# Patient Record
Sex: Male | Born: 1955 | Race: White | Hispanic: No | Marital: Married | Smoking: Never smoker
Health system: Southern US, Community
[De-identification: ages and names within clinical notes are randomized; demographics above are authoritative.]

## PROBLEM LIST (undated history)

## (undated) DIAGNOSIS — I498 Other specified cardiac arrhythmias: Secondary | ICD-10-CM

## (undated) DIAGNOSIS — I471 Supraventricular tachycardia, unspecified: Secondary | ICD-10-CM

## (undated) DIAGNOSIS — M1990 Unspecified osteoarthritis, unspecified site: Secondary | ICD-10-CM

## (undated) DIAGNOSIS — R002 Palpitations: Secondary | ICD-10-CM

## (undated) HISTORY — DX: Supraventricular tachycardia, unspecified: I47.10

## (undated) HISTORY — PX: NO PAST SURGERIES: SHX2092

## (undated) HISTORY — DX: Supraventricular tachycardia: I47.1

## (undated) HISTORY — DX: Palpitations: R00.2

## (undated) HISTORY — DX: Unspecified osteoarthritis, unspecified site: M19.90

## (undated) HISTORY — DX: Other specified cardiac arrhythmias: I49.8

---

## 2000-08-10 ENCOUNTER — Encounter: Admission: RE | Admit: 2000-08-10 | Discharge: 2000-08-26 | Payer: Self-pay | Admitting: *Deleted

## 2001-07-31 ENCOUNTER — Encounter: Payer: Self-pay | Admitting: Emergency Medicine

## 2001-07-31 ENCOUNTER — Emergency Department (HOSPITAL_COMMUNITY): Admission: EM | Admit: 2001-07-31 | Discharge: 2001-07-31 | Payer: Self-pay | Admitting: Emergency Medicine

## 2003-05-11 ENCOUNTER — Encounter: Payer: Self-pay | Admitting: Orthopedic Surgery

## 2003-05-11 ENCOUNTER — Ambulatory Visit (HOSPITAL_COMMUNITY): Admission: RE | Admit: 2003-05-11 | Discharge: 2003-05-11 | Payer: Self-pay | Admitting: Orthopedic Surgery

## 2003-05-24 ENCOUNTER — Ambulatory Visit (HOSPITAL_BASED_OUTPATIENT_CLINIC_OR_DEPARTMENT_OTHER): Admission: RE | Admit: 2003-05-24 | Discharge: 2003-05-24 | Payer: Self-pay | Admitting: Orthopedic Surgery

## 2006-10-25 ENCOUNTER — Encounter: Admission: RE | Admit: 2006-10-25 | Discharge: 2006-10-25 | Payer: Self-pay | Admitting: Internal Medicine

## 2006-11-02 ENCOUNTER — Encounter: Admission: RE | Admit: 2006-11-02 | Discharge: 2006-11-02 | Payer: Self-pay | Admitting: Internal Medicine

## 2010-12-03 ENCOUNTER — Ambulatory Visit
Admission: RE | Admit: 2010-12-03 | Discharge: 2010-12-03 | Disposition: A | Discharge status: Home / Self Care | Payer: BC Managed Care – PPO | Source: Ambulatory Visit | Attending: Family Medicine | Admitting: Family Medicine

## 2010-12-03 ENCOUNTER — Other Ambulatory Visit: Payer: Self-pay | Admitting: Family Medicine

## 2010-12-03 DIAGNOSIS — M79604 Pain in right leg: Secondary | ICD-10-CM

## 2013-12-19 ENCOUNTER — Encounter: Payer: Self-pay | Admitting: Cardiology

## 2014-01-24 ENCOUNTER — Ambulatory Visit: Payer: BC Managed Care – PPO | Admitting: Cardiology

## 2014-02-13 ENCOUNTER — Ambulatory Visit: Payer: BC Managed Care – PPO | Admitting: Cardiology

## 2014-02-15 ENCOUNTER — Encounter: Payer: Self-pay | Admitting: Cardiology

## 2014-02-15 ENCOUNTER — Ambulatory Visit (INDEPENDENT_AMBULATORY_CARE_PROVIDER_SITE_OTHER): Payer: BC Managed Care – PPO | Admitting: Cardiology

## 2014-02-15 VITALS — BP 135/78 | HR 53 | Ht 71.0 in | Wt 214.0 lb

## 2014-02-15 DIAGNOSIS — I471 Supraventricular tachycardia: Secondary | ICD-10-CM

## 2014-02-15 DIAGNOSIS — R002 Palpitations: Secondary | ICD-10-CM

## 2014-02-15 NOTE — Patient Instructions (Signed)
Your physician recommends that you continue on your current medications as directed. Please refer to the Current Medication list given to you today.  Your physician wants you to follow-up in: 1 year with Dr. Skains. You will receive a reminder letter in the mail two months in advance. If you don't receive a letter, please call our office to schedule the follow-up appointment.  

## 2014-02-15 NOTE — Progress Notes (Signed)
[address] [place], [place]  [address] Phone: [phone](336) [phone] Fax:  [phone]([phone]  Date:  02/15/2014   ID:  Colton Davidson, DOB 30-Sep-1956, MRN [id number]  PCP:  No primary provider on file.   History of Present Illness: Colton Davidson is a 58 y.o. male who is quite vigorous/active who is here for the follow up of palpitations.  Most of his occurring episodes occurred late at night and wake him up from sleep. Feels a thumping and feeling in his throat. LDL cholesterol 92. Drinks 3 cups of coffee in the morning green tea in the afternoon occasionally. Exercise is about 6 days a week vigorously for 15 minutes. Occasional anti-inflammatory after exercising. Electrolytes were unremarkable, TSH was normal, EKG demonstrated sinus bradycardia 48. No other acute changes.   Father MI in his 8060's, CABG in 6080's. Mother alive at 3490's   Palpitations have decreased since stopping caffiene. No syncope, no dizziness. No chest pain. No SOB.  HOLTER:  Minimum heart rate 34 at 5:55 AM, average 55, maximum 91 beats per minute. 129 PVCs, 188 PACs. There were runs of atrial tachycardia, 20 beats was the longest at 145 beats per minute. IMPRESSION: PVCs/PACs/PAT most likely cause of palpitations. No adverse arrhythmias identified.  He tried low-dose diltiazem 120 mg once a day long-acting. He noted that his heart rate did not increase as much during exercise. He noted some mild variation in his palpitations but not significant change. He stopped taking it.  He has no high-risk symptoms such as syncope, dizziness, chest pain. He is an avid Database administratorsoccer player.  Has not taken diltiazem in 2 years. Brief flutter. Very rare. He suffered soccer concussions.    Wt Readings from Last 3 Encounters:  02/15/14 214 lb (97.07 kg)     Past Medical History  Diagnosis Date  . Osteoarthritis     back  . Paroxysmal supraventricular tachycardia   . Palpitations   . Other specified cardiac  dysrhythmias(427.89)     bradycardia    No past surgical history on file.  Current Outpatient Prescriptions  Medication Sig Dispense Refill  . ibuprofen (ADVIL,MOTRIN) 100 MG tablet Take 100 mg by mouth as needed for fever.      . diltiazem (CARDIZEM) 120 MG tablet Take 120 mg by mouth as needed.        No current facility-administered medications for this visit.    Allergies:   No Known Allergies  Social History:  The patient  reports that he has never smoked. He does not have any smokeless tobacco history on file. He reports that he drinks alcohol. He reports that he does not use illicit drugs.   No family history on file.  ROS:  Please see the history of present illness.   Denies any fevers, chills, orthopnea, PND   All other systems reviewed and negative.   PHYSICAL EXAM: VS:  BP 135/78  Pulse 53  Ht 5\' 11"  (1.803 m)  Wt 214 lb (97.07 kg)  BMI 29.86 kg/m2 Well nourished, well developed, in no acute distress HEENT: normal, [place]/AT, EOMI Neck: no JVD, normal carotid upstroke, no bruit Cardiac:  normal S1, S2; RRR; no murmur Lungs:  clear to auscultation bilaterally, no wheezing, rhonchi or rales Abd: soft, nontender, no hepatomegaly, no bruits Ext: no edema, 2+ distal pulses Skin: warm and dry GU: deferred Neuro: no focal abnormalities noted, AAO x 3  EKG:  None today     ASSESSMENT AND  PLAN:  1. Palpitations-he has not taken diltiazem in several years. Continue to monitor. He desires to come back on a yearly basis for further cardiology monitoring. 2. Paroxysmal atrial tachycardia-no adverse symptoms. Continue to monitor.  Signed, Donato SchultzMark Sandeep Radell, MD [facility]  02/15/2014 2:13 PM

## 2015-02-18 ENCOUNTER — Ambulatory Visit (INDEPENDENT_AMBULATORY_CARE_PROVIDER_SITE_OTHER): Payer: BLUE CROSS/BLUE SHIELD | Admitting: Cardiology

## 2015-02-18 ENCOUNTER — Encounter: Payer: Self-pay | Admitting: Cardiology

## 2015-02-18 VITALS — BP 120/76 | HR 46 | Ht 71.0 in | Wt 224.4 lb

## 2015-02-18 DIAGNOSIS — I471 Supraventricular tachycardia: Secondary | ICD-10-CM | POA: Insufficient documentation

## 2015-02-18 DIAGNOSIS — R001 Bradycardia, unspecified: Secondary | ICD-10-CM | POA: Insufficient documentation

## 2015-02-18 DIAGNOSIS — R002 Palpitations: Secondary | ICD-10-CM | POA: Diagnosis not present

## 2015-02-18 NOTE — Progress Notes (Signed)
[address] [place], [place]  [address] Phone: [phone](336) [phone] Fax:  [phone]  Date:  02/18/2015   ID:  Colton Davidson, DOB 25-Jan-1956, MRN [id number]  PCP:  Johny BlamerHARRIS, WILLIAM, MD   History of Present Illness: Colton Davidson is a 59 y.o. male who is quite vigorous/active who is here for the follow up of palpitations.  Most of his occurring episodes occurred late at night and wake him up from sleep. Feels a thumping and feeling in his throat. LDL cholesterol 92. Drinks 3 cups of coffee in the morning green tea in the afternoon occasionally. Exercise is about 6 days a week vigorously for 15 minutes. Occasional anti-inflammatory after exercising. Electrolytes were unremarkable, TSH was normal, EKG demonstrated sinus bradycardia 48. No other acute changes.   Father MI in his 4160's, CABG in 3380's. Mother alive at 3090's   Palpitations have decreased since stopping caffiene. No syncope, no dizziness. No chest pain. No SOB.  HOLTER:  Minimum heart rate 34 at 5:55 AM, average 55, maximum 91 beats per minute. 129 PVCs, 188 PACs. There were runs of atrial tachycardia, 20 beats was the longest at 145 beats per minute. IMPRESSION: PVCs/PACs/PAT most likely cause of palpitations. No adverse arrhythmias identified.  He tried low-dose diltiazem 120 mg once a day long-acting. He noted that his heart rate did not increase as much during exercise. He noted some mild variation in his palpitations but not significant change. He stopped taking it.  He has no high-risk symptoms such as syncope, dizziness, chest pain. He is an avid Database administratorsoccer player.   Has not taken diltiazem inl several years. Brief flutter. Very rare.   Enjoys playing soccer. Had a back injury. Currently doing therapy for this. He has not noticed any syncopal episodes. 3 children are going to schools, [employer], private [place] in [place], [employer] now [employer].   Wt Readings from Last 3 Encounters:  02/18/15 224 lb  6.4 oz (101.787 kg)  02/15/14 214 lb (97.07 kg)     Past Medical History  Diagnosis Date  . Osteoarthritis     back  . Paroxysmal supraventricular tachycardia   . Palpitations   . Other specified cardiac dysrhythmias(427.89)     bradycardia    History reviewed. No pertinent past surgical history.  Current Outpatient Prescriptions  Medication Sig Dispense Refill  . diltiazem (CARDIZEM) 120 MG tablet Take 120 mg by mouth as needed (A-Fib).     Marland Kitchen. ibuprofen (ADVIL,MOTRIN) 100 MG tablet Take 100 mg by mouth as needed for fever.     No current facility-administered medications for this visit.    Allergies:   No Known Allergies  Social History:  The patient  reports that he has never smoked. He does not have any smokeless tobacco history on file. He reports that he drinks alcohol. He reports that he does not use illicit drugs.   Family History  Problem Relation Age of Onset  . Hyperlipidemia Mother   . Heart attack Father   . Heart disease Father   . Heart failure Father     ROS:  Please see the history of present illness.   Denies any fevers, chills, orthopnea, PND. No syncope.   All other systems reviewed and negative.   PHYSICAL EXAM: VS:  BP 120/76 mmHg  Pulse 46  Ht 5\' 11"  (1.803 m)  Wt 224 lb 6.4 oz (101.787 kg)  BMI 31.31 kg/m2 Well nourished, well developed, in no acute  distress HEENT: normal, [place]/AT, EOMI Neck: no JVD, normal carotid upstroke, no bruit Cardiac:  normal S1, S2; RRR; no murmur Lungs:  clear to auscultation bilaterally, no wheezing, rhonchi or rales Abd: soft, nontender, no hepatomegaly, no bruits Ext: no edema, 2+ distal pulses Skin: warm and dry GU: deferred Neuro: no focal abnormalities noted, AAO x 3  EKG:  02/18/15-sinus bradycardia, 46, first-degree AV block, incomplete right bundle branch block. No significant change from prior EKG.   ASSESSMENT AND PLAN:  1. Palpitations-he has not taken diltiazem in several years. Continue to monitor.  He desires to come back on a yearly basis for further cardiology monitoring. Mild weight gain noted. He is monitoring this. Decrease physical activity. He feels overall well. 2. Sinus bradycardia-asymptomatic. Continue to monitor. 3. Paroxysmal atrial tachycardia-no adverse symptoms. Continue to monitor. 4. One-year follow-up.  Signed, Donato Schultz, MD [facility]  02/18/2015 10:08 AM

## 2015-02-18 NOTE — Patient Instructions (Signed)
Medication Instructions:  Your physician recommends that you continue on your current medications as directed. Please refer to the Current Medication list given to you today.  Labwork: None  Testing/Procedures: None  Follow-Up: Follow up in 1 year with Dr. Skains.  You will receive a letter in the mail 2 months before you are due.  Please call us when you receive this letter to schedule your follow up appointment.  Thank you for choosing [facility]!!       

## 2016-02-21 ENCOUNTER — Encounter: Payer: Self-pay | Admitting: Cardiology

## 2016-02-21 ENCOUNTER — Ambulatory Visit (INDEPENDENT_AMBULATORY_CARE_PROVIDER_SITE_OTHER): Payer: BLUE CROSS/BLUE SHIELD | Admitting: Cardiology

## 2016-02-21 VITALS — BP 130/84 | HR 56 | Ht 71.0 in | Wt 218.0 lb

## 2016-02-21 DIAGNOSIS — I471 Supraventricular tachycardia: Secondary | ICD-10-CM

## 2016-02-21 DIAGNOSIS — E785 Hyperlipidemia, unspecified: Secondary | ICD-10-CM

## 2016-02-21 DIAGNOSIS — R001 Bradycardia, unspecified: Secondary | ICD-10-CM

## 2016-02-21 NOTE — Patient Instructions (Signed)
Medication Instructions:  Your physician recommends that you continue on your current medications as directed. Please refer to the Current Medication list given to you today.   Labwork: Your physician recommends that you return for lab work in: FASTING (Lipid/Liver)   Testing/Procedures: none  Follow-Up: Your physician wants you to follow-up in: 12 months with Dr. Anne FuSkains. You will receive a reminder letter in the mail two months in advance. If you don't receive a letter, please call our office to schedule the follow-up appointment.   Any Other Special Instructions Will Be Listed Below (If Applicable).     If you need a refill on your cardiac medications before your next appointment, please call your pharmacy.

## 2016-02-21 NOTE — Progress Notes (Signed)
[address] [place], [place]  [address] Phone: [phone](336) [phone] Fax:  [phone](336) [phone]  Date:  02/21/2016   ID:  Colton Davidson, DOB 28-Nov-1955, MRN [id number]  PCP:  Johny BlamerHARRIS, WILLIAM, MD   History of Present Illness: Colton BaltimoreJoseph M Student is a 60 y.o. male who is quite vigorous/active who is here for the follow up of palpitations.  Most of his occurring episodes occurred late at night and wake him up from sleep. Feels a thumping and feeling in his throat. LDL cholesterol 92. Drinks 3 cups of coffee in the morning green tea in the afternoon occasionally. Exercise is about 6 days a week vigorously for 15 minutes. Occasional anti-inflammatory after exercising. Electrolytes were unremarkable, TSH was normal, EKG demonstrated sinus bradycardia 48. No other acute changes.   Father MI in his 7060's, CABG in 9280's. Mother alive at 4890's   Palpitations have decreased since stopping caffiene. No syncope, no dizziness. No chest pain. No SOB.  HOLTER:  Minimum heart rate 34 at 5:55 AM, average 55, maximum 91 beats per minute. 129 PVCs, 188 PACs. There were runs of atrial tachycardia, 20 beats was the longest at 145 beats per minute. IMPRESSION: PVCs/PACs/PAT most likely cause of palpitations. No adverse arrhythmias identified.  He tried low-dose diltiazem 120 mg once a day long-acting but has not taken diltiazem in several years.. He noted that his heart rate did not increase as much during exercise. He noted some mild variation in his palpitations but not significant change. He stopped taking it.  He has no high-risk symptoms such as syncope, dizziness, chest pain. He is an avid Database administratorsoccer player.   Enjoys playing soccer. Had a back injury. Currently doing therapy for this. Other than this, he is doing very well, staying active. His parents lived into their 6290s. No chest pain He has not noticed any syncopal episodes. 3 children are going to schools, [employer], private college in [place], [employer] now [employer] graduate engineering.     Wt Readings from Last 3 Encounters:  02/21/16 218 lb (98.884 kg)  02/18/15 224 lb 6.4 oz (101.787 kg)  02/15/14 214 lb (97.07 kg)     Past Medical History  Diagnosis Date  . Osteoarthritis     back  . Paroxysmal supraventricular tachycardia (HCC)   . Palpitations   . Other specified cardiac dysrhythmias(427.89)     bradycardia    Past Surgical History  Procedure Laterality Date  . No past surgeries      Current Outpatient Prescriptions  Medication Sig Dispense Refill  . diltiazem (CARDIZEM) 120 MG tablet Take 120 mg by mouth as needed (A-Fib).     Marland Kitchen. ibuprofen (ADVIL,MOTRIN) 100 MG tablet Take 100 mg by mouth as needed for fever.     No current facility-administered medications for this visit.    Allergies:   No Known Allergies  Social History:  The patient  reports that he has never smoked. He does not have any smokeless tobacco history on file. He reports that he drinks alcohol. He reports that he does not use illicit drugs.   Family History  Problem Relation Age of Onset  . Hyperlipidemia Mother   . Heart attack Father   . Heart disease Father   . Heart failure Father     ROS:  Please see the history of present illness.   Denies any fevers, chills, orthopnea, PND. No syncope.   All other systems reviewed and negative.   PHYSICAL  EXAM: VS:  BP 130/84 mmHg  Pulse 56  Ht  (1.803 m)  Wt 218 lb (98.884 kg)  BMI 30.42 kg/m2 Well nourished, well developed, in no acute distress HEENT: normal, [place]/AT, EOMI Neck: no JVD, normal carotid upstroke, no bruit Cardiac:  normal S1, S2; RRR; no murmur Lungs:  clear to auscultation bilaterally, no wheezing, rhonchi or rales Abd: soft, nontender, no hepatomegaly, no bruits Ext: no edema, 2+ distal pulses Skin: warm and dry GU: deferred Neuro: no focal abnormalities noted, AAO x 3  EKG:  EKG was ordered today.  02/21/16-sinus bradycardia rate 56, PVC, normal intervals, nonspecific  T-wave changes in 3, aVF. Personally viewed-no change 02/18/15-sinus bradycardia, 46, first-degree AV block, incomplete right bundle branch block. No significant change from prior EKG.   ASSESSMENT AND PLAN:  1. Palpitations-he has not taken diltiazem in several years. Continue to monitor. He desires to come back on a yearly basis for further cardiology monitoring. Mild weight gain noted. He is monitoring this. Doing well, no changes. Baseline  2. Sinus bradycardia-asymptomatic. Continue to monitor. 3. Paroxysmal atrial tachycardia-no adverse symptoms. Continue to monitor. 4. Lipid screening-we'll check lipid profile. 5. One-year follow-up.  Signed, Donato Schultz, MD [facility]  02/21/2016 8:26 AM

## 2016-02-24 ENCOUNTER — Other Ambulatory Visit (INDEPENDENT_AMBULATORY_CARE_PROVIDER_SITE_OTHER): Payer: BLUE CROSS/BLUE SHIELD | Admitting: *Deleted

## 2016-02-24 DIAGNOSIS — E785 Hyperlipidemia, unspecified: Secondary | ICD-10-CM | POA: Diagnosis not present

## 2016-02-24 LAB — HEPATIC FUNCTION PANEL
ALK PHOS: 47 U/L (ref 40–115)
ALT: 19 U/L (ref 9–46)
AST: 21 U/L (ref 10–35)
Albumin: 3.9 g/dL (ref 3.6–5.1)
BILIRUBIN INDIRECT: 0.3 mg/dL (ref 0.2–1.2)
Bilirubin, Direct: 0.1 mg/dL (ref <=0.2)
Total Bilirubin: 0.4 mg/dL (ref 0.2–1.2)
Total Protein: 6.1 g/dL (ref 6.1–8.1)

## 2016-02-24 LAB — LIPID PANEL
Cholesterol: 177 mg/dL (ref 125–200)
HDL: 54 mg/dL (ref >=40)
LDL CALC: 105 mg/dL (ref <130)
TRIGLYCERIDES: 88 mg/dL (ref <150)
Total CHOL/HDL Ratio: 3.3 Ratio (ref <=5.0)
VLDL: 18 mg/dL (ref <30)

## 2016-02-24 NOTE — Addendum Note (Signed)
Addended by: Tonita PhoenixBOWDEN, Acea Yagi K on: 02/24/2016 07:47 AM   Modules accepted: Orders

## 2016-02-24 NOTE — Addendum Note (Signed)
Addended by: Tonita PhoenixBOWDEN, Anjolie Majer K on: 02/24/2016 07:39 AM   Modules accepted: Orders

## 2016-08-02 ENCOUNTER — Ambulatory Visit (INDEPENDENT_AMBULATORY_CARE_PROVIDER_SITE_OTHER): Payer: PRIVATE HEALTH INSURANCE

## 2016-08-02 ENCOUNTER — Encounter (HOSPITAL_COMMUNITY): Payer: Self-pay | Admitting: Family Medicine

## 2016-08-02 ENCOUNTER — Ambulatory Visit (HOSPITAL_COMMUNITY)
Admission: EM | Admit: 2016-08-02 | Discharge: 2016-08-02 | Disposition: A | Discharge status: Home / Self Care | Payer: PRIVATE HEALTH INSURANCE | Attending: Internal Medicine | Admitting: Internal Medicine

## 2016-08-02 DIAGNOSIS — S63615A Unspecified sprain of left ring finger, initial encounter: Secondary | ICD-10-CM

## 2016-08-02 NOTE — ED Triage Notes (Signed)
Pt here for injury to left ring finger. sts he hit it with a soccer ball last night,. Finger very swollen and ring in place.

## 2016-08-02 NOTE — Discharge Instructions (Signed)
NO FRACTURE

## 2016-08-02 NOTE — ED Provider Notes (Signed)
CSN: [id number]     Arrival date & time 08/02/16  1310 History   First MD Initiated Contact with Patient 08/02/16 1405     Chief Complaint  Patient presents with  . Finger Injury   (Consider location/radiation/quality/duration/timing/severity/associated sxs/prior Treatment) HPI NP PT WAS PLAYING SOCCER, BALL HIT TIP OF LEFT RING FINGER. NOW SWOLLEN, BRUISED. WEDDING RING IN SITU. HOME TREATMENT INCLUDES ICE ELEVATION, TYLENOL.  Past Medical History:  Diagnosis Date  . Osteoarthritis    back  . Other specified cardiac dysrhythmias(427.89)    bradycardia  . Palpitations   . Paroxysmal supraventricular tachycardia [facility])    Past Surgical History:  Procedure Laterality Date  . NO PAST SURGERIES     Family History  Problem Relation Age of Onset  . Hyperlipidemia Mother   . Heart attack Father   . Heart disease Father   . Heart failure Father    Social History  Substance Use Topics  . Smoking status: Never Smoker  . Smokeless tobacco: Never Used  . Alcohol use Yes    Review of Systems  Denies: HEADACHE, NAUSEA, ABDOMINAL PAIN, CHEST PAIN, CONGESTION, DYSURIA, SHORTNESS OF BREATH  Allergies  Review of patient's allergies indicates no known allergies.  Home Medications   Prior to Admission medications   Medication Sig Start Date End Date Taking? Authorizing Provider  diltiazem (CARDIZEM) 120 MG tablet Take 120 mg by mouth as needed (A-Fib).     Historical Provider, MD  ibuprofen (ADVIL,MOTRIN) 100 MG tablet Take 100 mg by mouth as needed for fever.    Historical Provider, MD   Meds Ordered and Administered this Visit  Medications - No data to display  BP 128/72   Pulse (!) 54   Temp 98.6 F (37 C)   Resp 18   SpO2 100%  No data found.   Physical Exam NURSES NOTES AND VITAL SIGNS REVIEWED. CONSTITUTIONAL: Well developed, well nourished, no acute distress HEENT: normocephalic, atraumatic EYES: Conjunctiva normal NECK:normal ROM, supple, no  adenopathy PULMONARY:No respiratory distress, normal effort ABDOMINAL: Soft, ND, NT BS+, No CVAT MUSCULOSKELETAL: Normal ROM of all extremities, LEFT RING FINGER, SWOLLEN AT PIP, WITH VOLAR BRUISING NOTED.  SKIN: warm and dry without rash PSYCHIATRIC: Mood and affect, behavior are normal  Urgent Care Course   Clinical Course    Procedures (including critical care time)  Labs Review Labs Reviewed - No data to display  Imaging Review Dg Finger Ring Left  Result Date: 08/02/2016 CLINICAL DATA:  Finger injury with swelling. EXAM: LEFT RING FINGER 2+V COMPARISON:  None. FINDINGS: Three views study shows no evidence for an acute fracture. No subluxation or dislocation. The patient was unable to remove wedding ring which obscures a short segment of the mid proximal phalanx. Alignment of the bone is preserved across the ring making an obscured proximal phalanx fracture unlikely. IMPRESSION: Three views study shows marked soft tissue swelling without evidence of fracture or dislocation. A very short segment of the mid proximal phalanx is obscured by the wedding ring. Electronically Signed   By: Kennith Center M.D.   On: 08/02/2016 14:35     Visual Acuity Review  Right Eye Distance:   Left Eye Distance:   Bilateral Distance:    Right Eye Near:   Left Eye Near:    Bilateral Near:         MDM   1. Sprain of left ring finger, unspecified site of finger, initial encounter     Patient is reassured that there are no  issues that require transfer to higher level of care at this time or additional tests. Patient is advised to continue home symptomatic treatment. Patient is advised that if there are new or worsening symptoms to attend the emergency department, contact primary care provider, or return to UC. Instructions of care provided discharged home in stable condition.    THIS NOTE WAS GENERATED USING A VOICE RECOGNITION SOFTWARE PROGRAM. ALL REASONABLE EFFORTS  WERE MADE TO  PROOFREAD THIS DOCUMENT FOR ACCURACY.  I have verbally reviewed the discharge instructions with the patient. A printed AVS was given to the patient.  All questions were answered prior to discharge.      Tharon AquasFrank C Sarahgrace Broman, PA 08/02/16 [phone]

## 2017-02-23 ENCOUNTER — Encounter: Payer: Self-pay | Admitting: Cardiology

## 2017-02-23 ENCOUNTER — Ambulatory Visit (INDEPENDENT_AMBULATORY_CARE_PROVIDER_SITE_OTHER): Payer: PRIVATE HEALTH INSURANCE | Admitting: Cardiology

## 2017-02-23 ENCOUNTER — Encounter (INDEPENDENT_AMBULATORY_CARE_PROVIDER_SITE_OTHER): Payer: Self-pay

## 2017-02-23 VITALS — BP 138/70 | HR 58 | Ht 71.0 in | Wt 211.6 lb

## 2017-02-23 DIAGNOSIS — I209 Angina pectoris, unspecified: Secondary | ICD-10-CM | POA: Diagnosis not present

## 2017-02-23 DIAGNOSIS — R002 Palpitations: Secondary | ICD-10-CM | POA: Diagnosis not present

## 2017-02-23 DIAGNOSIS — I471 Supraventricular tachycardia: Secondary | ICD-10-CM | POA: Diagnosis not present

## 2017-02-23 DIAGNOSIS — R001 Bradycardia, unspecified: Secondary | ICD-10-CM | POA: Diagnosis not present

## 2017-02-23 DIAGNOSIS — Z01818 Encounter for other preprocedural examination: Secondary | ICD-10-CM

## 2017-02-23 LAB — PROTIME-INR
INR: 1 (ref 0.8–1.2)
PROTHROMBIN TIME: 10.5 s (ref 9.1–12.0)

## 2017-02-23 LAB — BASIC METABOLIC PANEL
BUN / CREAT RATIO: 16 (ref 9–20)
BUN: 15 mg/dL (ref 6–24)
CALCIUM: 9.1 mg/dL (ref 8.7–10.2)
CHLORIDE: 102 mmol/L (ref 96–106)
CO2: 22 mmol/L (ref 18–29)
CREATININE: 0.96 mg/dL (ref 0.76–1.27)
GFR calc Af Amer: 100 mL/min/{1.73_m2} (ref >59)
GFR calc non Af Amer: 87 mL/min/{1.73_m2} (ref >59)
GLUCOSE: 99 mg/dL (ref 65–99)
Potassium: 4.7 mmol/L (ref 3.5–5.2)
Sodium: 140 mmol/L (ref 134–144)

## 2017-02-23 LAB — CBC
HEMOGLOBIN: 14 g/dL (ref 13.0–17.7)
Hematocrit: 40.8 % (ref 37.5–51.0)
MCH: 32 pg (ref 26.6–33.0)
MCHC: 34.3 g/dL (ref 31.5–35.7)
MCV: 93 fL (ref 79–97)
PLATELETS: 210 10*3/uL (ref 150–379)
RBC: 4.37 x10E6/uL (ref 4.14–5.80)
RDW: 13.4 % (ref 12.3–15.4)
WBC: 5.2 10*3/uL (ref 3.4–10.8)

## 2017-02-23 MED ORDER — NITROGLYCERIN 0.4 MG SL SUBL: 0.4000 mg | SUBLINGUAL_TABLET | SUBLINGUAL | 99 refills | Status: AC | PRN

## 2017-02-23 MED ORDER — ASPIRIN EC 81 MG PO TBEC: 81.0000 mg | DELAYED_RELEASE_TABLET | Freq: Every day | ORAL | 3 refills | Status: DC

## 2017-02-23 NOTE — Patient Instructions (Signed)
Medication Instructions:  Please stop your Diltiazem. Start ASA 81 mg a day. You may use SL Ntg as needed for chest pain. Continue all other medications as listed.  Labwork: Please have blood work today (CBC, BMP and PT/INR)  Testing/Procedures: Your physician has requested that you have a cardiac catheterization. Cardiac catheterization is used to diagnose and/or treat various heart conditions. Doctors may recommend this procedure for a number of different reasons. The most common reason is to evaluate chest pain. Chest pain can be a symptom of coronary artery disease (CAD), and cardiac catheterization can show whether plaque is narrowing or blocking your heart's arteries. This procedure is also used to evaluate the valves, as well as measure the blood flow and oxygen levels in different parts of your heart. For further information please visit [email or web address] Please follow instruction sheet, as given.  Follow-Up: Follow up 2 to 3 weeks after your cardiac cath.  Hold off on any heavy workouts/activities until after your cardiac cath.  If you need a refill on your cardiac medications before your next appointment, please call your pharmacy.  Thank you for choosing [facility]!!    Nitroglycerin sublingual tablets What is this medicine? NITROGLYCERIN (nye troe GLI ser in) is a type of vasodilator. It relaxes blood vessels, increasing the blood and oxygen supply to your heart. This medicine is used to relieve chest pain caused by angina. It is also used to prevent chest pain before activities like climbing stairs, going outdoors in cold weather, or sexual activity. This medicine may be used for other purposes; ask your health care provider or pharmacist if you have questions. COMMON BRAND NAME(S): Nitroquick, Nitrostat, Nitrotab What should I tell my health care provider before I take this medicine? They need to know if you have any of these conditions: -anemia -head  injury, recent stroke, or bleeding in the brain -liver disease -previous heart attack -an unusual or allergic reaction to nitroglycerin, other medicines, foods, dyes, or preservatives -pregnant or trying to get pregnant -breast-feeding How should I use this medicine? Take this medicine by mouth as needed. At the first sign of an angina attack (chest pain or tightness) place one tablet under your tongue. You can also take this medicine 5 to 10 minutes before an event likely to produce chest pain. Follow the directions on the prescription label. Let the tablet dissolve under the tongue. Do not swallow whole. Replace the dose if you accidentally swallow it. It will help if your mouth is not dry. Saliva around the tablet will help it to dissolve more quickly. Do not eat or drink, smoke or chew tobacco while a tablet is dissolving. If you are not better within 5 minutes after taking ONE dose of nitroglycerin, call 9-1-1 immediately to seek emergency medical care. Do not take more than 3 nitroglycerin tablets over 15 minutes. If you take this medicine often to relieve symptoms of angina, your doctor or health care professional may provide you with different instructions to manage your symptoms. If symptoms do not go away after following these instructions, it is important to call 9-1-1 immediately. Do not take more than 3 nitroglycerin tablets over 15 minutes. Talk to your pediatrician regarding the use of this medicine in children. Special care may be needed. Overdosage: If you think you have taken too much of this medicine contact a poison control center or emergency room at once. NOTE: This medicine is only for you. Do not share this medicine with others. What if I miss  a dose? This does not apply. This medicine is only used as needed. What may interact with this medicine? Do not take this medicine with any of the following medications: -certain migraine medicines like ergotamine and dihydroergotamine  (DHE) -medicines used to treat erectile dysfunction like sildenafil, tadalafil, and vardenafil -riociguat This medicine may also interact with the following medications: -alteplase -aspirin -heparin -medicines for high blood pressure -medicines for mental depression -other medicines used to treat angina -phenothiazines like chlorpromazine, mesoridazine, prochlorperazine, thioridazine This list may not describe all possible interactions. Give your health care provider a list of all the medicines, herbs, non-prescription drugs, or dietary supplements you use. Also tell them if you smoke, drink alcohol, or use illegal drugs. Some items may interact with your medicine. What should I watch for while using this medicine? Tell your doctor or health care professional if you feel your medicine is no longer working. Keep this medicine with you at all times. Sit or lie down when you take your medicine to prevent falling if you feel dizzy or faint after using it. Try to remain calm. This will help you to feel better faster. If you feel dizzy, take several deep breaths and lie down with your feet propped up, or bend forward with your head resting between your knees. You may get drowsy or dizzy. Do not drive, use machinery, or do anything that needs mental alertness until you know how this drug affects you. Do not stand or sit up quickly, especially if you are an older patient. This reduces the risk of dizzy or fainting spells. Alcohol can make you more drowsy and dizzy. Avoid alcoholic drinks. Do not treat yourself for coughs, colds, or pain while you are taking this medicine without asking your doctor or health care professional for advice. Some ingredients may increase your blood pressure. What side effects may I notice from receiving this medicine? Side effects that you should report to your doctor or health care professional as soon as possible: -blurred vision -dry mouth -skin rash -sweating -the  feeling of extreme pressure in the head -unusually weak or tired Side effects that usually do not require medical attention (report to your doctor or health care professional if they continue or are bothersome): -flushing of the face or neck -headache -irregular heartbeat, palpitations -nausea, vomiting This list may not describe all possible side effects. Call your doctor for medical advice about side effects. You may report side effects to FDA at 1-800-FDA-1088. Where should I keep my medicine? Keep out of the reach of children. Store at room temperature between 20 and 25 degrees C (68 and 77 degrees F). Store in Retail buyeroriginal container. Protect from light and moisture. Keep tightly closed. Throw away any unused medicine after the expiration date. NOTE: This sheet is a summary. It may not cover all possible information. If you have questions about this medicine, talk to your doctor, pharmacist, or health care provider.  2018 Elsevier/Gold Standard (2013-08-03 17:57:36)    [facility] [address] [facility] [address], [address] [place] [address] Dept: [phone] Loc: [phone]  Henderson BaltimoreJoseph M Renninger  02/23/2017  You are scheduled for a cardiac cath on Thursday , Mar 04, 2017 with Dr. Swazil[place].  1. Please [facility]: [address] [place], [place] [address] at 5:30 am (two hours before your procedure to ensure your preparation). Free valet parking service is available.  Special note: Every effort is made to have your procedure done on time. Please understand that emergencies sometimes delay scheduled procedures.  2. Diet: Nothing to eat of drink after midnight.  3. Labs: Please have blood work today (BMP, CBC and PT/INR)  4. Medication instructions in preparation for your procedure:  On the morning of your procedure, take your ASA.  You may use sips of  water.  5. Plan for one night stay--bring personal belongings. 6. Bring a current list of your medications and current insurance cards. 7. You MUST have a responsible person to drive you home. 8. Someone MUST be with you the first 24 hours after you arrive home or your discharge will be delayed. 9. Please wear clothes that are easy to get on and off and wear slip-on shoes.  Thank you for allowing [place] to care for you!   -- [facility]

## 2017-02-23 NOTE — Progress Notes (Signed)
[address] [place], [place]  [address] Phone: [phone] Fax:  [phone]  Date:  02/23/2017   ID:  Colton Davidson, DOB 1956/08/14, MRN [id number]  PCP:  Johny Blamer, MD   History of Present Illness: Colton Davidson is a 61 y.o. male who is quite vigorous/active who is here for the follow up of palpitations.  Most of his occurring episodes occurred late at night and wake him up from sleep. Feels a thumping and feeling in his throat.  Used to drinks 3 cups of coffee in the morning green tea in the afternoon occasionally. Exercises about 6 days a week vigorously for 15 minutes. Occasional anti-inflammatory after exercising. Electrolytes were unremarkable, TSH was normal, EKG demonstrated sinus bradycardia 48. No other acute changes.   Father MI in his 64's, CABG in 81's. Mother alive at 66's   Palpitations have decreased since stopping caffiene. Denies syncope, dizziness, orthopnea, PND, fevers.   Since our last visit, he has been experiencing chest tightness, burning when heavily exerting himself with radiation to his neck and jaw that is relieved when he decreases his effort. He has been able to reduplicate the symptoms. His EKG also on 02/23/17 demonstrates new T-wave inversions in V3 (biphasic), V4 V5 and V6. These are concerning for ischemia. He felt associated shortness of breath although it is not unusual for him to push himself and feel winded during heavy exercise. No syncope, no lightheadedness. The symptoms began 10 minutes into exercise when pushing himself on the stairmaster or elliptical machine for instance. HOLTER:  Minimum heart rate 34 at 5:55 AM, average 55, maximum 91 beats per minute. 129 PVCs, 188 PACs. There were runs of atrial tachycardia, 20 beats was the longest at 145 beats per minute. IMPRESSION: PVCs/PACs/PAT most likely cause of palpitations. No adverse arrhythmias identified.  He tried low-dose diltiazem 120 mg once a day long-acting at  one point but has not taken diltiazem in several years.. He noted that his heart rate did not increase as much during exercise. He noted some mild variation in his palpitations but not significant change. He stopped taking it. He is an avid Database administrator.  His parents lived into their 28s. 3 children are going to schools, [employer], private college in [place], [employer] now [employer].     Wt Readings from Last 3 Encounters:  02/23/17 211 lb 9.6 oz (96 kg)  02/21/16 218 lb (98.9 kg)  02/18/15 224 lb 6.4 oz (101.8 kg)     Past Medical History:  Diagnosis Date  . Osteoarthritis    back  . Other specified cardiac dysrhythmias(427.89)    bradycardia  . Palpitations   . Paroxysmal supraventricular tachycardia [facility])     Past Surgical History:  Procedure Laterality Date  . NO PAST SURGERIES      Current Outpatient Prescriptions  Medication Sig Dispense Refill  . diltiazem (CARDIZEM) 120 MG tablet Take 120 mg by mouth as needed (A-Fib).     Marland Kitchen ibuprofen (ADVIL,MOTRIN) 100 MG tablet Take 100 mg by mouth as needed for fever.     No current facility-administered medications for this visit.     Allergies:   No Known Allergies  Social History:  The patient  reports that he has never smoked. He has never used smokeless tobacco. He reports that he drinks alcohol. He reports that he does not use drugs.   Family History  Problem Relation Age of Onset  .  Hyperlipidemia Mother   . Heart attack Father   . Heart disease Father   . Heart failure Father     ROS:  Please see the history of present illness.   Denies any fevers, chills, orthopnea, PND. No syncope.   Unless specified above, all other ROS negative  PHYSICAL EXAM: VS:  BP 138/70   Pulse (!) 58   Ht 5\' 11"  (1.803 m)   Wt 211 lb 9.6 oz (96 kg)   BMI 29.51 kg/m  GEN: Well nourished, well developed, in no acute distress  HEENT: normal  Neck: no JVD, carotid bruits, or masses Cardiac: RRR; no murmurs,  rubs, or gallops,no edema  Respiratory:  clear to auscultation bilaterally, normal work of breathing GI: soft, nontender, nondistended, + BS MS: no deformity or atrophy  Skin: warm and dry, no rash Neuro:  Alert and Oriented x 3, Strength and sensation are intact Psych: euthymic mood, full affect   EKG:  EKG was ordered today.  02/23/17-sinus bradycardia first degree AV block, PVC noted, nonspecific T-wave changes in V3 through V6 with T-wave changes out in V4 through V6 seemed new when compared to prior. 02/21/16-sinus bradycardia rate 56, PVC, normal intervals, nonspecific T-wave changes in , aVF. Personally viewed-no change 02/18/15-sinus bradycardia, 46, first-degree AV block, incomplete right bundle branch block. No significant change from prior EKG.   ASSESSMENT AND PLAN:  Exertional angina with EKG abnormality concerning for ischemia  - Given his father's family history and chest tightness upon exertional activity and EKG with T-wave inversion/ST depression in V6, biphasic T-wave in V3, T-wave inversion in V4 and V5, I would like to proceed with diagnostic heart catheterization via the right radial artery approach. Risks and benefits have been explained including stroke, heart attack, death, bleeding, renal impairment. He is willing to proceed. [place] to [place] to be with his son who is getting several awards at his college. He will be able to do this cardiac catheterization next week.  - Prescribing aspirin 81 mg.  - Nitroglycerin sublingual when necessary.  - No beta blocker because of resting bradycardia.  - Hold off from heavy exertional exercise.  Palpitations  - Since decreasing caffeine use, these have subsided  - No longer is he taking diltiazem  - Overall doing quite well. PVC noted on ECG.  Sinus bradycardia  - Ranging from the 40s to 50s. Asymptomatic. No pacemaker warranted.  Paroxysmal atrial tachycardia  - Seen previously on Holter monitor.  - Overall stable  without any symptoms.  Post catheterization follow-up.  Signed, Donato SchultzMark Skains, MD [facility]  02/23/2017 8:25 AM

## 2017-03-04 ENCOUNTER — Encounter (HOSPITAL_COMMUNITY)
Admission: RE | Disposition: A | Discharge status: Home / Self Care | Payer: Self-pay | Source: Ambulatory Visit | Attending: Cardiology

## 2017-03-04 ENCOUNTER — Ambulatory Visit (HOSPITAL_COMMUNITY)
Admission: RE | Admit: 2017-03-04 | Discharge: 2017-03-04 | Disposition: A | Discharge status: Home / Self Care | Payer: PRIVATE HEALTH INSURANCE | Source: Ambulatory Visit | Attending: Cardiology | Admitting: Cardiology

## 2017-03-04 DIAGNOSIS — R079 Chest pain, unspecified: Secondary | ICD-10-CM | POA: Diagnosis present

## 2017-03-04 DIAGNOSIS — I25118 Atherosclerotic heart disease of native coronary artery with other forms of angina pectoris: Secondary | ICD-10-CM | POA: Diagnosis not present

## 2017-03-04 DIAGNOSIS — M1990 Unspecified osteoarthritis, unspecified site: Secondary | ICD-10-CM | POA: Diagnosis not present

## 2017-03-04 DIAGNOSIS — I471 Supraventricular tachycardia: Secondary | ICD-10-CM | POA: Diagnosis not present

## 2017-03-04 DIAGNOSIS — I25119 Atherosclerotic heart disease of native coronary artery with unspecified angina pectoris: Secondary | ICD-10-CM | POA: Diagnosis not present

## 2017-03-04 DIAGNOSIS — I4510 Unspecified right bundle-branch block: Secondary | ICD-10-CM | POA: Diagnosis not present

## 2017-03-04 DIAGNOSIS — I209 Angina pectoris, unspecified: Secondary | ICD-10-CM

## 2017-03-04 DIAGNOSIS — I208 Other forms of angina pectoris: Secondary | ICD-10-CM | POA: Diagnosis present

## 2017-03-04 DIAGNOSIS — Z8249 Family history of ischemic heart disease and other diseases of the circulatory system: Secondary | ICD-10-CM | POA: Insufficient documentation

## 2017-03-04 DIAGNOSIS — I2102 ST elevation (STEMI) myocardial infarction involving left anterior descending coronary artery: Secondary | ICD-10-CM

## 2017-03-04 DIAGNOSIS — I440 Atrioventricular block, first degree: Secondary | ICD-10-CM | POA: Insufficient documentation

## 2017-03-04 DIAGNOSIS — R9431 Abnormal electrocardiogram [ECG] [EKG]: Secondary | ICD-10-CM | POA: Diagnosis present

## 2017-03-04 DIAGNOSIS — I493 Ventricular premature depolarization: Secondary | ICD-10-CM | POA: Diagnosis not present

## 2017-03-04 DIAGNOSIS — I4719 Other supraventricular tachycardia: Secondary | ICD-10-CM | POA: Diagnosis present

## 2017-03-04 DIAGNOSIS — Z955 Presence of coronary angioplasty implant and graft: Secondary | ICD-10-CM

## 2017-03-04 HISTORY — PX: CORONARY STENT INTERVENTION: CATH118234

## 2017-03-04 HISTORY — PX: LEFT HEART CATH AND CORONARY ANGIOGRAPHY: CATH118249

## 2017-03-04 LAB — POCT ACTIVATED CLOTTING TIME
ACTIVATED CLOTTING TIME: 219 s
ACTIVATED CLOTTING TIME: 450 s
Activated Clotting Time: 202 seconds
Activated Clotting Time: 323 seconds

## 2017-03-04 SURGERY — LEFT HEART CATH AND CORONARY ANGIOGRAPHY
Anesthesia: LOCAL

## 2017-03-04 MED ORDER — IOPAMIDOL (ISOVUE-370) INJECTION 76%
INTRAVENOUS | Status: AC
Start: 2017-03-04 — End: ?
  Filled 2017-03-04: qty 100

## 2017-03-04 MED ORDER — SODIUM CHLORIDE 0.9% FLUSH: 3.0000 mL | Freq: Two times a day (BID) | INTRAVENOUS | Status: DC

## 2017-03-04 MED ORDER — VERAPAMIL HCL 2.5 MG/ML IV SOLN
INTRAVENOUS | Status: AC
  Filled 2017-03-04: qty 2

## 2017-03-04 MED ORDER — ASPIRIN 81 MG PO CHEW: 81.0000 mg | CHEWABLE_TABLET | Freq: Every day | ORAL | Status: DC

## 2017-03-04 MED ORDER — FENTANYL CITRATE (PF) 100 MCG/2ML IJ SOLN
INTRAMUSCULAR | Status: AC
  Filled 2017-03-04: qty 2

## 2017-03-04 MED ORDER — IOPAMIDOL (ISOVUE-370) INJECTION 76%
INTRAVENOUS | Status: DC | PRN
  Administered 2017-03-04: 220 mL via INTRA_ARTERIAL

## 2017-03-04 MED ORDER — HEPARIN (PORCINE) IN NACL 2-0.9 UNIT/ML-% IJ SOLN
INTRAMUSCULAR | Status: DC | PRN
  Administered 2017-03-04: 08:00:00 via INTRA_ARTERIAL

## 2017-03-04 MED ORDER — CLOPIDOGREL BISULFATE 300 MG PO TABS
ORAL_TABLET | ORAL | Status: AC
  Filled 2017-03-04: qty 1

## 2017-03-04 MED ORDER — SODIUM CHLORIDE 0.9 % IV SOLN: 250.0000 mL | INTRAVENOUS | Status: DC | PRN

## 2017-03-04 MED ORDER — FENTANYL CITRATE (PF) 100 MCG/2ML IJ SOLN
INTRAMUSCULAR | Status: DC | PRN
  Administered 2017-03-04: 25 ug via INTRAVENOUS

## 2017-03-04 MED ORDER — HEPARIN (PORCINE) IN NACL 2-0.9 UNIT/ML-% IJ SOLN
INTRAMUSCULAR | Status: AC | PRN
  Administered 2017-03-04: 1000 mL via INTRA_ARTERIAL

## 2017-03-04 MED ORDER — ATORVASTATIN CALCIUM 80 MG PO TABS
80.0000 mg | ORAL_TABLET | Freq: Every day | ORAL | Status: DC
  Administered 2017-03-04: 80 mg via ORAL
  Filled 2017-03-04: qty 1

## 2017-03-04 MED ORDER — HEPARIN SODIUM (PORCINE) 1000 UNIT/ML IJ SOLN
INTRAMUSCULAR | Status: AC
  Filled 2017-03-04: qty 1

## 2017-03-04 MED ORDER — SODIUM CHLORIDE 0.9 % WEIGHT BASED INFUSION
3.0000 mL/kg/h | INTRAVENOUS | Status: DC
  Administered 2017-03-04: 3 mL/kg/h via INTRAVENOUS

## 2017-03-04 MED ORDER — ASPIRIN 81 MG PO CHEW
81.0000 mg | CHEWABLE_TABLET | ORAL | Status: AC
  Administered 2017-03-04: 81 mg via ORAL

## 2017-03-04 MED ORDER — HEPARIN SODIUM (PORCINE) 1000 UNIT/ML IJ SOLN
INTRAMUSCULAR | Status: AC
Start: 2017-03-04 — End: ?
  Filled 2017-03-04: qty 1

## 2017-03-04 MED ORDER — IOPAMIDOL (ISOVUE-370) INJECTION 76%
INTRAVENOUS | Status: AC
  Filled 2017-03-04: qty 100

## 2017-03-04 MED ORDER — CLOPIDOGREL BISULFATE 300 MG PO TABS
ORAL_TABLET | ORAL | Status: DC | PRN
  Administered 2017-03-04: 600 mg via ORAL

## 2017-03-04 MED ORDER — ACETAMINOPHEN 325 MG PO TABS: 650.0000 mg | ORAL_TABLET | ORAL | Status: DC | PRN

## 2017-03-04 MED ORDER — MIDAZOLAM HCL 2 MG/2ML IJ SOLN
INTRAMUSCULAR | Status: DC | PRN
  Administered 2017-03-04: 2 mg via INTRAVENOUS

## 2017-03-04 MED ORDER — SODIUM CHLORIDE 0.9% FLUSH: 3.0000 mL | INTRAVENOUS | Status: DC | PRN

## 2017-03-04 MED ORDER — ANGIOPLASTY BOOK
Freq: Once | Status: DC
  Filled 2017-03-04: qty 1

## 2017-03-04 MED ORDER — ASPIRIN 81 MG PO CHEW
CHEWABLE_TABLET | ORAL | Status: AC
  Filled 2017-03-04: qty 1

## 2017-03-04 MED ORDER — CLOPIDOGREL BISULFATE 75 MG PO TABS: 75.0000 mg | ORAL_TABLET | Freq: Every day | ORAL | 10 refills | Status: DC

## 2017-03-04 MED ORDER — ATORVASTATIN CALCIUM 80 MG PO TABS: 80.0000 mg | ORAL_TABLET | Freq: Every day | ORAL | 6 refills | Status: DC

## 2017-03-04 MED ORDER — LIDOCAINE HCL (PF) 1 % IJ SOLN
INTRAMUSCULAR | Status: DC | PRN
  Administered 2017-03-04: 2 mL via INTRADERMAL

## 2017-03-04 MED ORDER — FAMOTIDINE IN NACL 20-0.9 MG/50ML-% IV SOLN
INTRAVENOUS | Status: AC
  Filled 2017-03-04: qty 50

## 2017-03-04 MED ORDER — LIDOCAINE HCL 1 % IJ SOLN
INTRAMUSCULAR | Status: AC
  Filled 2017-03-04: qty 20

## 2017-03-04 MED ORDER — FAMOTIDINE IN NACL 20-0.9 MG/50ML-% IV SOLN
INTRAVENOUS | Status: AC | PRN
  Administered 2017-03-04: 20 mg via INTRAVENOUS

## 2017-03-04 MED ORDER — CLOPIDOGREL BISULFATE 75 MG PO TABS: 75.0000 mg | ORAL_TABLET | Freq: Every day | ORAL | Status: DC

## 2017-03-04 MED ORDER — ASPIRIN EC 81 MG PO TBEC: 81.0000 mg | DELAYED_RELEASE_TABLET | Freq: Every day | ORAL | 0 refills | Status: DC

## 2017-03-04 MED ORDER — CLOPIDOGREL BISULFATE 75 MG PO TABS: 75.0000 mg | ORAL_TABLET | Freq: Every day | ORAL | 0 refills | Status: DC

## 2017-03-04 MED ORDER — SODIUM CHLORIDE 0.9 % WEIGHT BASED INFUSION: 1.0000 mL/kg/h | INTRAVENOUS | Status: DC

## 2017-03-04 MED ORDER — CLOPIDOGREL BISULFATE 300 MG PO TABS
ORAL_TABLET | ORAL | Status: AC
Start: 2017-03-04 — End: ?
  Filled 2017-03-04: qty 2

## 2017-03-04 MED ORDER — SODIUM CHLORIDE 0.9 % WEIGHT BASED INFUSION: 1.0000 mL/kg/h | INTRAVENOUS | Status: AC

## 2017-03-04 MED ORDER — MIDAZOLAM HCL 2 MG/2ML IJ SOLN
INTRAMUSCULAR | Status: AC
  Filled 2017-03-04: qty 2

## 2017-03-04 MED ORDER — CLOPIDOGREL BISULFATE 300 MG PO TABS
ORAL_TABLET | ORAL | Status: AC
  Filled 2017-03-04: qty 2

## 2017-03-04 MED ORDER — ONDANSETRON HCL 4 MG/2ML IJ SOLN: 4.0000 mg | Freq: Four times a day (QID) | INTRAMUSCULAR | Status: DC | PRN

## 2017-03-04 MED ORDER — HEPARIN SODIUM (PORCINE) 1000 UNIT/ML IJ SOLN
INTRAMUSCULAR | Status: DC | PRN
  Administered 2017-03-04 (×2): 4000 [IU] via INTRAVENOUS
  Administered 2017-03-04: 3000 [IU] via INTRAVENOUS
  Administered 2017-03-04: 5000 [IU] via INTRAVENOUS

## 2017-03-04 SURGICAL SUPPLY — 22 items
BALLN EMERGE MR 2.5X12 (BALLOONS) ×2
BALLN ~~LOC~~ EUPHORA RX 4.5X12 (BALLOONS) ×2
BALLOON EMERGE MR 2.5X12 (BALLOONS) IMPLANT
BALLOON ~~LOC~~ EUPHORA RX 4.5X12 (BALLOONS) IMPLANT
CATH 5FR JL3.5 JR4 ANG PIG MP (CATHETERS) ×1 IMPLANT
CATH INFINITI 5 FR MPA2 (CATHETERS) ×1 IMPLANT
CATH VISTA GUIDE 6FR XBLAD3.5 (CATHETERS) ×1 IMPLANT
DEVICE RAD COMP TR BAND LRG (VASCULAR PRODUCTS) ×1 IMPLANT
GLIDESHEATH SLEND SS 6F .021 (SHEATH) ×1 IMPLANT
GUIDEWIRE INQWIRE 1.5J.035X260 (WIRE) IMPLANT
INQWIRE 1.5J .035X260CM (WIRE) ×2
KIT ENCORE 26 ADVANTAGE (KITS) ×1 IMPLANT
KIT HEART LEFT (KITS) ×2 IMPLANT
PACK CARDIAC CATHETERIZATION (CUSTOM PROCEDURE TRAY) ×2 IMPLANT
STENT RESOLUTE ONYX 4.0X15 (Permanent Stent) ×1 IMPLANT
SYR MEDRAD MARK V 150ML (SYRINGE) ×2 IMPLANT
TRANSDUCER W/STOPCOCK (MISCELLANEOUS) ×2 IMPLANT
TUBING CIL FLEX 10 FLL-RA (TUBING) ×2 IMPLANT
WIRE ASAHI FIELDER XT 190CM (WIRE) ×1 IMPLANT
WIRE ASAHI PROWATER 180CM (WIRE) ×2 IMPLANT
WIRE HI TORQ WHISPER MS 190CM (WIRE) ×1 IMPLANT
WIRE PT2 MS 185 (WIRE) ×1 IMPLANT

## 2017-03-04 NOTE — Discharge Instructions (Signed)
Radial Site Care °Refer to this sheet in the next few weeks. These instructions provide you with information about caring for yourself after your procedure. Your health care provider may also give you more specific instructions. Your treatment has been planned according to current medical practices, but problems sometimes occur. Call your health care provider if you have any problems or questions after your procedure. °What can I expect after the procedure? °After your procedure, it is typical to have the following: °· Bruising at the radial site that usually fades within 1-2 weeks. °· Blood collecting in the tissue (hematoma) that may be painful to the touch. It should usually decrease in size and tenderness within 1-2 weeks. °Follow these instructions at home: °· Take medicines only as directed by your health care provider. °· You may shower 24-48 hours after the procedure or as directed by your health care provider. Remove the bandage (dressing) and gently wash the site with plain soap and water. Pat the area dry with a clean towel. Do not rub the site, because this may cause bleeding. °· Do not take baths, swim, or use a hot tub until your health care provider approves. °· Check your insertion site every day for redness, swelling, or drainage. °· Do not apply powder or lotion to the site. °· Do not flex or bend the affected arm for 24 hours or as directed by your health care provider. °· Do not push or pull heavy objects with the affected arm for 24 hours or as directed by your health care provider. °· Do not lift over 10 lb (4.5 kg) for 5 days after your procedure or as directed by your health care provider. °· Ask your health care provider when it is okay to: °¨ Return to work or school. °¨ Resume usual physical activities or sports. °¨ Resume sexual activity. °· Do not drive home if you are discharged the same day as the procedure. Have someone else drive you. °· You may drive 24 hours after the procedure  unless otherwise instructed by your health care provider. °· Do not operate machinery or power tools for 24 hours after the procedure. °· If your procedure was done as an outpatient procedure, which means that you went home the same day as your procedure, a responsible adult should be with you for the first 24 hours after you arrive home. °· Keep all follow-up visits as directed by your health care provider. This is important. °Contact a health care provider if: °· You have a fever. °· You have chills. °· You have increased bleeding from the radial site. Hold pressure on the site. °Get help right away if: °· You have unusual pain at the radial site. °· You have redness, warmth, or swelling at the radial site. °· You have drainage (other than a small amount of blood on the dressing) from the radial site. °· The radial site is bleeding, and the bleeding does not stop after 30 minutes of holding steady pressure on the site. °· Your arm or hand becomes pale, cool, tingly, or numb. °This information is not intended to replace advice given to you by your health care provider. Make sure you discuss any questions you have with your health care provider. °Document Released: 11/07/2010 Document Revised: 03/12/2016 Document Reviewed: 04/23/2014 °Elsevier Interactive Patient Education © 2017 Elsevier Inc. ° °

## 2017-03-04 NOTE — Interval H&P Note (Signed)
History and Physical Interval Note:  03/04/2017 7:16 AM  Henderson BaltimoreJoseph M Eggert  has presented today for surgery, with the diagnosis of cp  The various methods of treatment have been discussed with the patient and family. After consideration of risks, benefits and other options for treatment, the patient has consented to  Procedure(s): Left Heart Cath and Coronary Angiography (N/A) as a surgical intervention .  The patient's history has been reviewed, patient examined, no change in status, stable for surgery.  I have reviewed the patient's chart and labs.  Questions were answered to the patient's satisfaction.   Cath Lab Visit (complete for each Cath Lab visit)  Clinical Evaluation Leading to the Procedure:   ACS: No.  Non-ACS:    Anginal Classification: CCS II  Anti-ischemic medical therapy: No Therapy  Non-Invasive Test Results: No non-invasive testing performed  Prior CABG: No previous CABG        Theron Aristaeter [facility] CenterJordanMD,FACC 03/04/2017 7:16 AM

## 2017-03-04 NOTE — H&P (View-Only) (Signed)
[address], [address] [place], [place]  [address] Phone: [phone] Fax:  [phone]  Date:  02/23/2017   ID:  Colton Davidson, DOB 1956/08/14, MRN [id number]  PCP:  Johny Blamer, MD   History of Present Illness: Colton Davidson is a 61 y.o. male who is quite vigorous/active who is here for the follow up of palpitations.  Most of his occurring episodes occurred late at night and wake him up from sleep. Feels a thumping and feeling in his throat.  Used to drinks 3 cups of coffee in the morning green tea in the afternoon occasionally. Exercises about 6 days a week vigorously for 15 minutes. Occasional anti-inflammatory after exercising. Electrolytes were unremarkable, TSH was normal, EKG demonstrated sinus bradycardia 48. No other acute changes.   Father MI in his 64's, CABG in 81's. Mother alive at 66's   Palpitations have decreased since stopping caffiene. Denies syncope, dizziness, orthopnea, PND, fevers.   Since our last visit, he has been experiencing chest tightness, burning when heavily exerting himself with radiation to his neck and jaw that is relieved when he decreases his effort. He has been able to reduplicate the symptoms. His EKG also on 02/23/17 demonstrates new T-wave inversions in V3 (biphasic), V4 V5 and V6. These are concerning for ischemia. He felt associated shortness of breath although it is not unusual for him to push himself and feel winded during heavy exercise. No syncope, no lightheadedness. The symptoms began 10 minutes into exercise when pushing himself on the stairmaster or elliptical machine for instance. HOLTER:  Minimum heart rate 34 at 5:55 AM, average 55, maximum 91 beats per minute. 129 PVCs, 188 PACs. There were runs of atrial tachycardia, 20 beats was the longest at 145 beats per minute. IMPRESSION: PVCs/PACs/PAT most likely cause of palpitations. No adverse arrhythmias identified.  He tried low-dose diltiazem 120 mg once a day long-acting at  one point but has not taken diltiazem in several years.. He noted that his heart rate did not increase as much during exercise. He noted some mild variation in his palpitations but not significant change. He stopped taking it. He is an avid Database administrator.  His parents lived into their 28s. 3 children are going to schools, [employer], private college in [place], [employer] now [employer].     Wt Readings from Last 3 Encounters:  02/23/17 211 lb 9.6 oz (96 kg)  02/21/16 218 lb (98.9 kg)  02/18/15 224 lb 6.4 oz (101.8 kg)     Past Medical History:  Diagnosis Date  . Osteoarthritis    back  . Other specified cardiac dysrhythmias(427.89)    bradycardia  . Palpitations   . Paroxysmal supraventricular tachycardia [facility])     Past Surgical History:  Procedure Laterality Date  . NO PAST SURGERIES      Current Outpatient Prescriptions  Medication Sig Dispense Refill  . diltiazem (CARDIZEM) 120 MG tablet Take 120 mg by mouth as needed (A-Fib).     Marland Kitchen ibuprofen (ADVIL,MOTRIN) 100 MG tablet Take 100 mg by mouth as needed for fever.     No current facility-administered medications for this visit.     Allergies:   No Known Allergies  Social History:  The patient  reports that he has never smoked. He has never used smokeless tobacco. He reports that he drinks alcohol. He reports that he does not use drugs.   Family History  Problem Relation Age of Onset  .  Hyperlipidemia Mother   . Heart attack Father   . Heart disease Father   . Heart failure Father     ROS:  Please see the history of present illness.   Denies any fevers, chills, orthopnea, PND. No syncope.   Unless specified above, all other ROS negative  PHYSICAL EXAM: VS:  BP 138/70   Pulse (!) 58   Ht 5\' 11"  (1.803 m)   Wt 211 lb 9.6 oz (96 kg)   BMI 29.51 kg/m  GEN: Well nourished, well developed, in no acute distress  HEENT: normal  Neck: no JVD, carotid bruits, or masses Cardiac: RRR; no murmurs,  rubs, or gallops,no edema  Respiratory:  clear to auscultation bilaterally, normal work of breathing GI: soft, nontender, nondistended, + BS MS: no deformity or atrophy  Skin: warm and dry, no rash Neuro:  Alert and Oriented x 3, Strength and sensation are intact Psych: euthymic mood, full affect   EKG:  EKG was ordered today.  02/23/17-sinus bradycardia first degree AV block, PVC noted, nonspecific T-wave changes in V3 through V6 with T-wave changes out in V4 through V6 seemed new when compared to prior. 02/21/16-sinus bradycardia rate 56, PVC, normal intervals, nonspecific T-wave changes in , aVF. Personally viewed-no change 02/18/15-sinus bradycardia, 46, first-degree AV block, incomplete right bundle branch block. No significant change from prior EKG.   ASSESSMENT AND PLAN:  Exertional angina with EKG abnormality concerning for ischemia  - Given his father's family history and chest tightness upon exertional activity and EKG with T-wave inversion/ST depression in V6, biphasic T-wave in V3, T-wave inversion in V4 and V5, I would like to proceed with diagnostic heart catheterization via the right radial artery approach. Risks and benefits have been explained including stroke, heart attack, death, bleeding, renal impairment. He is willing to proceed. [place] is [place] to [place] to be with his son who is getting several awards at his college. He will be able to do this cardiac catheterization next week.  - Prescribing aspirin 81 mg.  - Nitroglycerin sublingual when necessary.  - No beta blocker because of resting bradycardia.  - Hold off from heavy exertional exercise.  Palpitations  - Since decreasing caffeine use, these have subsided  - No longer is he taking diltiazem  - Overall doing quite well. PVC noted on ECG.  Sinus bradycardia  - Ranging from the 40s to 50s. Asymptomatic. No pacemaker warranted.  Paroxysmal atrial tachycardia  - Seen previously on Holter monitor.  - Overall stable  without any symptoms.  Post catheterization follow-up.  Signed, Donato SchultzMark Dulcie Gammon, MD [facility]  02/23/2017 8:25 AM

## 2017-03-04 NOTE — Progress Notes (Signed)
Ed completed with pt and wife. Voiced understanding. Encouraged him to ease back into his exercise. He understands Plavix and NTG. Will send referral to [facility]. [id number] Ethelda ChickKristan Leroy Trim CES, ACSM 03/04/2017 1:55 PM

## 2017-03-04 NOTE — Discharge Summary (Signed)
Discharge Summary    Patient ID: Colton Davidson,  MRN: [id number], DOB/AGE: 59-25-1957 59 y.o.  Admit date: 03/04/2017 Discharge date: 03/04/2017  Primary Care Provider: Johny Blamer Primary Cardiologist: Anne Fu  Discharge Diagnoses    Principal Problem:   Chest pain on exertion Active Problems:   Paroxysmal atrial tachycardia (HCC)   Abnormal ECG   Allergies No Known Allergies  Diagnostic Studies/Procedures    LHC: 03/04/17  Conclusion     Mid LAD to Dist LAD lesion, 20 %stenosed.  There is mild left ventricular systolic dysfunction.  LV end diastolic pressure is normal.  The left ventricular ejection fraction is 45-50% by visual estimate.  Prox LAD lesion, 95 %stenosed.  A STENT RESOLUTE ONYX 4.0X15 drug eluting stent was successfully placed.  Post intervention, there is a 0% residual stenosis.   1. Single vessel obstructive CAD. 95% proximal LAD 2. Mild LV dysfunction with anterior and apical HK 3. Normal LVEDP. 4. Successful stenting of the proximal LAD with DES.   Plan: DAPT for one year with ASA/Plavix. High dose statin therapy. Anticipate same day DC   _____________   History of Present Illness     Colton Davidson is a 61 y.o. male who is quite vigorous/active who was seen in the office by Dr. Anne Fu for follow up of palpitations.  Most of his occurring episodes occurred late at night and wake him up from sleep. Feels a thumping and feeling in his throat.  Used to drinks 3 cups of coffee in the morning green tea in the afternoon occasionally. Exercises about 6 days a week vigorously for 15 minutes. Occasional anti-inflammatory after exercising. Electrolytes were unremarkable, TSH was normal, EKG demonstrated sinus bradycardia 48. No other acute changes. Father MI in his 80's, CABG in 78's. Mother alive at 17's   Palpitations have decreased since stopping caffiene. Denies syncope, dizziness, orthopnea, PND, fevers.   Since his last visit, he  has been experiencing chest tightness, burning when heavily exerting himself with radiation to his neck and jaw that is relieved when he decreases his effort. He has been able to reduplicate the symptoms. His EKG also on 02/23/17 demonstrates new T-wave inversions in V3 (biphasic), V4 V5 and V6. These are concerning for ischemia. He felt associated shortness of breath although it is not unusual for him to push himself and feel winded during heavy exercise. No syncope, no lightheadedness. The symptoms began 10 minutes into exercise when pushing himself on the stairmaster or elliptical machine for instance. HOLTER:  Minimum heart rate 34 at 5:55 AM, average 55, maximum 91 beats per minute. 129 PVCs, 188 PACs. There were runs of atrial tachycardia, 20 beats was the longest at 145 beats per minute. IMPRESSION: PVCs/PACs/PAT most likely cause of palpitations. No adverse arrhythmias identified.  He tried low-dose diltiazem 120 mg once a day long-acting at one point but has not taken diltiazem in several years.. He noted that his heart rate did not increase as much during exercise. He noted some mild variation in his palpitations but not significant change. He stopped taking it. 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 3 children are going to schools, [employer], private college in [place], [employer] now [employer]. With his abnormal EKG and family hx he was referred for outpatient cardiac cath.   Hospital Course    He underwent cardiac cath with Dr. Swaziland noted above with DES x1 pLAD. He was placed on DAPT  with ASA and plavix for at least one year. Also added high dose statin prior to dc. He aws seen by cardiac rehab in short stay. Right radial site stable at the time of discharge. Restrictions reviewed with the patient and family at the bedside. Follow up in the office has been arranged. Medications are listed below.  _____________  Discharge Vitals Blood pressure  134/77, pulse (!) 48, temperature 98.4 F (36.9 C), resp. rate 17, height 5\' 11"  (1.803 m), weight 202 lb (91.6 kg), SpO2 100 %.  Filed Weights   03/04/17 0603  Weight: 202 lb (91.6 kg)    Labs & Radiologic Studies    CBC No results for input(s): WBC, NEUTROABS, HGB, HCT, MCV, PLT in the last 72 hours. Basic Metabolic Panel No results for input(s): NA, K, CL, CO2, GLUCOSE, BUN, CREATININE, CALCIUM, MG, PHOS in the last 72 hours. Liver Function Tests No results for input(s): AST, ALT, ALKPHOS, BILITOT, PROT, ALBUMIN in the last 72 hours. No results for input(s): LIPASE, AMYLASE in the last 72 hours. Cardiac Enzymes No results for input(s): CKTOTAL, CKMB, CKMBINDEX, TROPONINI in the last 72 hours. BNP Invalid input(s): POCBNP D-Dimer No results for input(s): DDIMER in the last 72 hours. Hemoglobin A1C No results for input(s): HGBA1C in the last 72 hours. Fasting Lipid Panel No results for input(s): CHOL, HDL, LDLCALC, TRIG, CHOLHDL, LDLDIRECT in the last 72 hours. Thyroid Function Tests No results for input(s): TSH, T4TOTAL, T3FREE, THYROIDAB in the last 72 hours.  Invalid input(s): FREET3 _____________  No results found. Disposition   Pt is being discharged home today in good condition.  Follow-up Plans & Appointments    Follow-up Information    Rosalio Macadamia, NP Follow up on 03/16/2017.   Specialties:  Nurse Practitioner, Interventional Cardiology, Cardiology, Radiology Why:  at 2pm for your follow up appt.  Contact information: [address] [place] [address] [phone]          Discharge Instructions    Amb Referral to Cardiac Rehabilitation    Complete by:  As directed    Diagnosis:   Coronary Stents PTCA     Call MD for:  redness, tenderness, or signs of infection (pain, swelling, redness, odor or green/yellow discharge around incision site)    Complete by:  As directed    Diet - low sodium heart healthy    Complete by:  As  directed    Discharge instructions    Complete by:  As directed    Radial Site Care Refer to this sheet in the next few weeks. These instructions provide you with information on caring for yourself after your procedure. Your caregiver may also give you more specific instructions. Your treatment has been planned according to current medical practices, but problems sometimes occur. Call your caregiver if you have any problems or questions after your procedure. HOME CARE INSTRUCTIONS You may shower the day after the procedure.Remove the bandage (dressing) and gently wash the site with plain soap and water.Gently pat the site dry.  Do not apply powder or lotion to the site.  Do not submerge the affected site in water for 3 to 5 days.  Inspect the site at least twice daily.  Do not flex or bend the affected arm for 24 hours.  No lifting over 5 pounds (2.3 kg) for 5 days after your procedure.  Do not drive home if you are discharged the same day of the procedure. Have someone else drive you.  You may drive 24 hours after the procedure unless otherwise instructed by your caregiver.  What to expect: Any bruising will usually fade within 1 to 2 weeks.  Blood that collects in the tissue (hematoma) may be painful to the touch. It should usually decrease in size and tenderness within 1 to 2 weeks.  SEEK IMMEDIATE MEDICAL CARE IF: You have unusual pain at the radial site.  You have redness, warmth, swelling, or pain at the radial site.  You have drainage (other than a small amount of blood on the dressing).  You have chills.  You have a fever or persistent symptoms for more than 72 hours.  You have a fever and your symptoms suddenly get worse.  Your arm becomes pale, cool, tingly, or numb.  You have heavy bleeding from the site. Hold pressure on the site.   Increase activity slowly    Complete by:  As directed       Discharge Medications   Current Discharge Medication List    START taking  these medications   Details  !! aspirin EC 81 MG tablet Take 1 tablet (81 mg total) by mouth daily. Qty: 30 tablet, Refills: 0    atorvastatin (LIPITOR) 80 MG tablet Take 1 tablet (80 mg total) by mouth daily at 6 PM. Qty: 30 tablet, Refills: 6    !! clopidogrel (PLAVIX) 75 MG tablet Take 1 tablet (75 mg total) by mouth daily. Qty: 30 tablet, Refills: 0    !! clopidogrel (PLAVIX) 75 MG tablet Take 1 tablet (75 mg total) by mouth daily with breakfast. Qty: 30 tablet, Refills: 10     !! - Potential duplicate medications found. Please discuss with provider.    CONTINUE these medications which have NOT CHANGED   Details  !! aspirin EC 81 MG tablet Take 1 tablet (81 mg total) by mouth daily. Qty: 90 tablet, Refills: 3    Clotrimazole (LOTRIMIN AF EX) Apply 1 application topically daily as needed (athlete's foot).    fluticasone (FLONASE) 50 MCG/ACT nasal spray Place 1 spray into both nostrils at bedtime as needed for allergies or rhinitis.    nitroGLYCERIN (NITROSTAT) 0.4 MG SL tablet Place 1 tablet (0.4 mg total) under the tongue every 5 (five) minutes as needed for chest pain. Qty: 25 tablet, Refills: prn     !! - Potential duplicate medications found. Please discuss with provider.    STOP taking these medications     naproxen sodium (ANAPROX) 220 MG tablet          Aspirin prescribed at discharge?  Yes High Intensity Statin Prescribed? (Lipitor 40-80mg  or Crestor 20-40mg ): Yes Beta Blocker Prescribed? No: Resting HR 40s For EF <40%, was ACEI/ARB Prescribed? No: Consider at outpatient follow up if blood pressure elevated ADP Receptor Inhibitor Prescribed? (i.e. Plavix etc.-Includes Medically Managed Patients): Yes For EF <40%, Aldosterone Inhibitor Prescribed? No: EF ok Was EF assessed during THIS hospitalization? Yes Was Cardiac Rehab II ordered? (Included Medically managed Patients): Yes   Outstanding Labs/Studies   N/A  Duration of Discharge Encounter   Greater  than 30 minutes including physician time.  Signed, Laverda PageLindsay Roberts NP-C 03/04/2017, 3:28 PM

## 2017-03-05 ENCOUNTER — Encounter (HOSPITAL_COMMUNITY): Payer: Self-pay | Admitting: Cardiology

## 2017-03-05 ENCOUNTER — Telehealth (HOSPITAL_COMMUNITY): Payer: Self-pay

## 2017-03-05 NOTE — Telephone Encounter (Signed)
Verified [facility]) insurance benefits through [employer]. No Coinsurance or Out of Pocket. Copay $50.00, Deductible $2,000.00 which pt has not met. Reference [phone].... KJ

## 2017-03-09 ENCOUNTER — Telehealth (HOSPITAL_COMMUNITY): Payer: Self-pay

## 2017-03-16 ENCOUNTER — Ambulatory Visit: Payer: PRIVATE HEALTH INSURANCE | Admitting: Nurse Practitioner

## 2017-03-16 NOTE — Progress Notes (Deleted)
CARDIOLOGY OFFICE NOTE  Date:  03/16/2017    Colton Davidson Date of Birth: 1956/09/11 Medical Record #[id number]  PCP:  Johny Blamer, MD  Cardiologist:  Rick Duff  No chief complaint on file.   History of Present Illness: Colton Davidson is a 61 y.o. male who presents today for a post cath/PCI visit. Seen for Dr. Anne Fu.   He has had a history of palpitations. Most recently seen here earlier this month with exertional chest pain and abnormal EKG - referred for cardiac cath - now s/p PCI with DES to the proximal DES - on DAPT for one year along with high dose statin therapy. FH is + for early heart disease.   Comes in today. Here with   Past Medical History:  Diagnosis Date  . Osteoarthritis    back  . Other specified cardiac dysrhythmias(427.89)    bradycardia  . Palpitations   . Paroxysmal supraventricular tachycardia [facility])     Past Surgical History:  Procedure Laterality Date  . CORONARY STENT INTERVENTION N/A 03/04/2017   Procedure: Coronary Stent Intervention;  Surgeon: Swaziland, Peter M, MD;  [facility] CV LAB;  Service: Cardiovascular;  Laterality: N/A;  prox lad  . LEFT HEART CATH AND CORONARY ANGIOGRAPHY N/A 03/04/2017   Procedure: Left Heart Cath and Coronary Angiography;  Surgeon: Swaziland, Peter M, MD;  Location: [facility] INVASIVE CV LAB;  Service: Cardiovascular;  Laterality: N/A;  . NO PAST SURGERIES       Medications: Current Outpatient Prescriptions  Medication Sig Dispense Refill  . aspirin EC 81 MG tablet Take 1 tablet (81 mg total) by mouth daily. 90 tablet 3  . aspirin EC 81 MG tablet Take 1 tablet (81 mg total) by mouth daily. 30 tablet 0  . atorvastatin (LIPITOR) 80 MG tablet Take 1 tablet (80 mg total) by mouth daily at 6 PM. 30 tablet 6  . clopidogrel (PLAVIX) 75 MG tablet Take 1 tablet (75 mg total) by mouth daily. 30 tablet 0  . clopidogrel (PLAVIX) 75 MG tablet Take 1 tablet (75 mg total) by mouth daily with breakfast. 30  tablet 10  . Clotrimazole (LOTRIMIN AF EX) Apply 1 application topically daily as needed (athlete's foot).    . fluticasone (FLONASE) 50 MCG/ACT nasal spray Place 1 spray into both nostrils at bedtime as needed for allergies or rhinitis.    Marland Kitchen nitroGLYCERIN (NITROSTAT) 0.4 MG SL tablet Place 1 tablet (0.4 mg total) under the tongue every 5 (five) minutes as needed for chest pain. 25 tablet prn   No current facility-administered medications for this visit.     Allergies: No Known Allergies  Social History: The patient  reports that he has never smoked. He has never used smokeless tobacco. He reports that he drinks alcohol. He reports that he does not use drugs.   Family History: The patient's family history includes Heart attack in his father; Heart disease in his father; Heart failure in his father; Hyperlipidemia in his mother.   Review of Systems: Please see the history of present illness.   Otherwise, the review of systems is positive for none.   All other systems are reviewed and negative.   Physical Exam: VS:  There were no vitals taken for this visit. Marland Kitchen  BMI There is no height or weight on file to calculate BMI.  Wt Readings from Last 3 Encounters:  03/04/17 202 lb (91.6 kg)  02/23/17 211 lb 9.6 oz (96 kg)  02/21/16 218  lb (98.9 kg)    General: Pleasant. Well developed, well nourished and in no acute distress.   HEENT: Normal.  Neck: Supple, no JVD, carotid bruits, or masses noted.  Cardiac: Regular rate and rhythm. No murmurs, rubs, or gallops. No edema.  Respiratory:  Lungs are clear to auscultation bilaterally with normal work of breathing.  GI: Soft and nontender.  MS: No deformity or atrophy. Gait and ROM intact.  Skin: Warm and dry. Color is normal.  Neuro:  Strength and sensation are intact and no gross focal deficits noted.  Psych: Alert, appropriate and with normal affect.   LABORATORY DATA:  EKG:  EKG is not ordered today.  Lab Results  Component Value  Date   WBC 5.2 02/23/2017   HCT 40.8 02/23/2017   PLT 210 02/23/2017   GLUCOSE 99 02/23/2017   CHOL 177 02/24/2016   TRIG 88 02/24/2016   HDL 54 02/24/2016   LDLCALC 105 02/24/2016   ALT 19 02/24/2016   AST 21 02/24/2016   NA 140 02/23/2017   K 4.7 02/23/2017   CL 102 02/23/2017   CREATININE 0.96 02/23/2017   BUN 15 02/23/2017   CO2 22 02/23/2017   INR 1.0 02/23/2017    BNP (last 3 results) No results for input(s): BNP in the last 8760 hours.  ProBNP (last 3 results) No results for input(s): PROBNP in the last 8760 hours.   Other Studies Reviewed Today: Procedures   Coronary Stent Intervention 02/2017  Left Heart Cath and Coronary Angiography  Conclusion     Mid LAD to Dist LAD lesion, 20 %stenosed.  There is mild left ventricular systolic dysfunction.  LV end diastolic pressure is normal.  The left ventricular ejection fraction is 45-50% by visual estimate.  Prox LAD lesion, 95 %stenosed.  A STENT RESOLUTE ONYX 4.0X15 drug eluting stent was successfully placed.  Post intervention, there is a 0% residual stenosis.   1. Single vessel obstructive CAD. 95% proximal LAD 2. Mild LV dysfunction with anterior and apical HK 3. Normal LVEDP. 4. Successful stenting of the proximal LAD with DES.   Plan: DAPT for one year with ASA/Plavix. High dose statin therapy. Anticipate same day DC.      Assessment/Plan: 1. Newly diagnosed CAD with recent PCI with DES to the proximal LAD - committed to DAPT for one year  2. HLD  3. FH + for early CAD  4. Palpitations   Current medicines are reviewed with the patient today.  The patient does not have concerns regarding medicines other than what has been noted above.  The following changes have been made:  See above.  Labs/ tests ordered today include:   No orders of the defined types were placed in this encounter.    Disposition:   FU with Dr. Anne FuSkains in 4 to 6 weeks with fasting labs.   Patient is agreeable to  this plan and will call if any problems develop in the interim.   SignedNorma Fredrickson: Early Steel, NP  03/16/2017 7:33 AM  [facility] [address] [place], [place]  [address] Phone: [phone] Fax: [phone]) [phone]

## 2017-03-18 ENCOUNTER — Encounter: Payer: Self-pay | Admitting: Nurse Practitioner

## 2017-03-23 ENCOUNTER — Encounter: Payer: Self-pay | Admitting: Cardiology

## 2017-03-23 ENCOUNTER — Ambulatory Visit (INDEPENDENT_AMBULATORY_CARE_PROVIDER_SITE_OTHER): Payer: PRIVATE HEALTH INSURANCE | Admitting: Cardiology

## 2017-03-23 VITALS — BP 108/70 | HR 53 | Ht 70.0 in | Wt 210.0 lb

## 2017-03-23 DIAGNOSIS — Z955 Presence of coronary angioplasty implant and graft: Secondary | ICD-10-CM | POA: Diagnosis not present

## 2017-03-23 DIAGNOSIS — I209 Angina pectoris, unspecified: Secondary | ICD-10-CM | POA: Diagnosis not present

## 2017-03-23 DIAGNOSIS — I2510 Atherosclerotic heart disease of native coronary artery without angina pectoris: Secondary | ICD-10-CM | POA: Diagnosis not present

## 2017-03-23 DIAGNOSIS — R001 Bradycardia, unspecified: Secondary | ICD-10-CM

## 2017-03-23 NOTE — Patient Instructions (Signed)
Medication Instructions:  The current medical regimen is effective;  continue present plan and medications.   Follow-Up: Follow up in 4 months with Dr. Skains.  You will receive a letter in the mail 2 months before you are due.  Please call us when you receive this letter to schedule your follow up appointment.   If you need a refill on your cardiac medications before your next appointment, please call your pharmacy.  Thank you for choosing [facility]!!      

## 2017-03-23 NOTE — Progress Notes (Signed)
Cardiology Office Note:    Date:  03/23/2017   ID:  Colton Davidson, DOB [birth year], MRN [id number]  PCP:  Johny Blamer, MD  Cardiologist:  Donato Schultz, MD    Referring MD: Johny Blamer, MD     History of Present Illness:    Colton Davidson is a 61 y.o. male here for follow up of CAD post prox LAD stent.  He came to see me on 02/23/17 and was stating that he was having chest tightness and burning when heavily exerting himself with radiation to his neck and jaw that seem to be relieved when he decreased his effort. He was able to reduplicate the symptoms. His EKG on 02/23/17 showed new T-wave inversion in V3, biphasic as well as V4 through V6. They were concerning for ischemia. He also felt some associated shortness of breath. They began about 10 minutes and exercise when pushing himself on stairmaster or elliptical machine for instance.  His father had myocardial infarction at age 58s, CABG in his 60s, mother is currently alive in her 1s.  Plays soccer.  Previously had an evaluation for palpitations with Holter monitor demonstrating minimum heart rate of 34 bpm 5 AM average of 55 maximum of 91 with 129 PVCs and 188 PACs. 20 beats of atrial tachycardia at 1 45 bpm noted. He was taking diltiazem for this but is no longer taking.  He was set up for cardiac catheterization and have this performed on 03/04/17 by Dr. Peter Swaziland. Proximal LAD disease was noted and successfully stented. Minimal LV dysfunction was noted with anterior and apical hypokinesis. Normal EDP. EF was 45-50%.  His parents lived into their 76s. 3 children are going to schools, [employer], private college in [place], [employer] now [employer].  Continuing with dual antiplatelet therapy. Plavix. High-dose statin was added. He feels great. In fact he has been back on the stairmaster doing well. He is going to cardiac rehabilitation Thursday.  Past Medical History:  Diagnosis Date  . Osteoarthritis      back  . Other specified cardiac dysrhythmias(427.89)    bradycardia  . Palpitations   . Paroxysmal supraventricular tachycardia [facility])     Past Surgical History:  Procedure Laterality Date  . CORONARY STENT INTERVENTION N/A 03/04/2017   Procedure: Coronary Stent Intervention;  Surgeon: Swaziland, Peter M, MD;  Location: [facility] INVASIVE CV LAB;  Service: Cardiovascular;  Laterality: N/A;  prox lad  . LEFT HEART CATH AND CORONARY ANGIOGRAPHY N/A 03/04/2017   Procedure: Left Heart Cath and Coronary Angiography;  Surgeon: Swaziland, Peter M, MD;  Location: [facility] INVASIVE CV LAB;  Service: Cardiovascular;  Laterality: N/A;  . NO PAST SURGERIES      Current Medications: Current Meds  Medication Sig  . aspirin EC 81 MG tablet Take 1 tablet (81 mg total) by mouth daily.  Marland Kitchen atorvastatin (LIPITOR) 80 MG tablet Take 1 tablet (80 mg total) by mouth daily at 6 PM.  . clopidogrel (PLAVIX) 75 MG tablet Take 1 tablet (75 mg total) by mouth daily.  . Clotrimazole (LOTRIMIN AF EX) Apply 1 application topically daily as needed (athlete's foot).  . fluticasone (FLONASE) 50 MCG/ACT nasal spray Place 1 spray into both nostrils at bedtime as needed for allergies or rhinitis.  Marland Kitchen nitroGLYCERIN (NITROSTAT) 0.4 MG SL tablet Place 1 tablet (0.4 mg total) under the tongue every 5 (five) minutes as needed for chest pain.     Allergies:   Patient has no known allergies.   Social  History   Social History  . Marital status: Married    Spouse name: N/A  . Number of children: N/A  . Years of education: N/A   Social History Main Topics  . Smoking status: Never Smoker  . Smokeless tobacco: Never Used  . Alcohol use Yes  . Drug use: No  . Sexual activity: Not Asked   Other Topics Concern  . None   Social History Narrative  . None     Family History: The patient's family history includes Heart attack in his father; Heart disease in his father; Heart failure in his father; Hyperlipidemia in his mother. ROS:    Please see the history of present illness.    No bleeding, no syncopal, no orthopnea, no PND All other systems reviewed and are negative.  EKGs/Labs/Other Studies Reviewed:    The following studies were reviewed today:  LHC: 03/04/17  Conclusion     Mid LAD to Dist LAD lesion, 20 %stenosed.  There is mild left ventricular systolic dysfunction.  LV end diastolic pressure is normal.  The left ventricular ejection fraction is 45-50% by visual estimate.  Prox LAD lesion, 95 %stenosed.  A STENT RESOLUTE ONYX 4.0X15 drug eluting stent was successfully placed.  Post intervention, there is a 0% residual stenosis.  1. Single vessel obstructive CAD. 95% proximal LAD 2. Mild LV dysfunction with anterior and apical HK 3. Normal LVEDP. 4. Successful stenting of the proximal LAD with DES.   Plan: DAPT for one year with ASA/Plavix. High dose statin therapy. Anticipate same day DC     EKG:  EKG is not ordered today.    Recent Labs: 02/23/2017: BUN 15; Creatinine, Ser 0.96; Platelets 210; Potassium 4.7; Sodium 140   Recent Lipid Panel    Component Value Date/Time   CHOL 177 02/24/2016 0747   TRIG 88 02/24/2016 0747   HDL 54 02/24/2016 0747   CHOLHDL 3.3 02/24/2016 0747   VLDL 18 02/24/2016 0747   LDLCALC 105 02/24/2016 0747    Physical Exam:    VS:  BP 108/70   Pulse (!) 53   Ht 5\' 10"  (1.778 m)   Wt 210 lb (95.3 kg)   SpO2 96%   BMI 30.13 kg/m     Wt Readings from Last 3 Encounters:  03/23/17 210 lb (95.3 kg)  03/04/17 202 lb (91.6 kg)  02/23/17 211 lb 9.6 oz (96 kg)     GEN: Well nourished, well developed in no acute distress HEENT: Normal NECK: No JVD; No carotid bruits LYMPHATICS: No lymphadenopathy CARDIAC: RRR, no murmurs, rubs, gallops RESPIRATORY:  Clear to auscultation without rales, wheezing or rhonchi  ABDOMEN: Soft, non-tender, non-distended MUSCULOSKELETAL:  No edema; No deformity cath site normal SKIN: Warm and dry NEUROLOGIC:  Alert and  oriented x 3 PSYCHIATRIC:  Normal affect   ASSESSMENT:    1. Coronary artery disease involving native coronary artery of native heart without angina pectoris   2. Angina pectoris (HCC)   3. Sinus bradycardia   4. Status post coronary artery stent placement   5. Coronary artery disease involving native heart, angina presence unspecified, unspecified vessel or lesion type    PLAN:    In order of problems listed above:  Coronary artery disease/angina  - Successful stent of proximal LAD disease relieving anginal symptoms.  - Dual antiplatelet therapy for at least one year, stent placed on 03/04/17.  - He had same day discharge.  - EF minimally reduced at 45-50%.  - Going to cardiac  rehabilitation Thursday  Sinus bradycardia  - Normal resting heart rate ranges between 40 and 50. He is asymptomatic. No pacemaker.  Paroxysmal atrial tachycardia  - Previously seen on Holter monitor. Stable.   Medication Adjustments/Labs and Tests Ordered: Current medicines are reviewed at length with the patient today.  Concerns regarding medicines are outlined above. Labs and tests ordered and medication changes are outlined in the patient instructions below:  Patient Instructions  Medication Instructions:  The current medical regimen is effective;  continue present plan and medications.  Follow-Up: Follow up in 4 months with Dr. Anne Fu.  You will receive a letter in the mail 2 months before you are due.  Please call us when you receive this letter to schedule your follow up appointment.  If you need a refill on your cardiac medications before your next appointment, please call your pharmacy.  Thank you for choosing [facility]!!        Signed, Donato Schultz, MD  03/23/2017 8:59 AM    [facility]

## 2017-03-25 ENCOUNTER — Encounter (HOSPITAL_COMMUNITY)
Admission: RE | Admit: 2017-03-25 | Discharge: 2017-03-25 | Disposition: A | Discharge status: Home / Self Care | Payer: PRIVATE HEALTH INSURANCE | Source: Ambulatory Visit | Attending: Cardiology | Admitting: Cardiology

## 2017-03-25 ENCOUNTER — Encounter (HOSPITAL_COMMUNITY): Payer: Self-pay

## 2017-03-25 DIAGNOSIS — Z955 Presence of coronary angioplasty implant and graft: Secondary | ICD-10-CM | POA: Diagnosis present

## 2017-03-25 DIAGNOSIS — Z48812 Encounter for surgical aftercare following surgery on the circulatory system: Secondary | ICD-10-CM | POA: Insufficient documentation

## 2017-03-25 NOTE — Progress Notes (Signed)
Cardiac Rehab Medication Review by a Pharmacist  Does the patient  feel that his/her medications are working for him/her?  yes  Has the patient been experiencing any side effects to the medications prescribed?  no  Does the patient measure his/her own blood pressure or blood glucose at home?  no   Does the patient have any problems obtaining medications due to transportation or finances?   no  Understanding of regimen: good Understanding of indications: good Potential of compliance: good  Pharmacist comments: [id number] YOM presenting for cardiac rehab in good spirits. He does not have compliance issues with his medications. He is not on any beta blocker or ACEI. He endorses heavy physical activity and very good resting HR (in 50s) and blood pressure, so additional meds probably not necessary.  Gwyndolyn KaufmanKai Destiny Hagin Bernette Redbird(Kenny), PharmD  PGY1 Pharmacy Resident Pager: [phone] 03/25/2017 8:23 AM

## 2017-03-25 NOTE — Progress Notes (Signed)
Cardiac Individual Treatment Plan  Patient Details  Name: Colton Davidson MRN: [id number] Date of Birth: October 16, 1956 Referring Provider:     CARDIAC REHAB PHASE II ORIENTATION from 03/25/2017 in [facility] CARDIAC REHAB  Referring Provider  Donato Schultz MD      Initial Encounter Date:    CARDIAC REHAB PHASE II ORIENTATION from 03/25/2017 in [facility] CARDIAC REHAB  Date  03/25/17  Referring Provider  Donato Schultz MD      Visit Diagnosis: 03/04/17 DES LAD  Patient's Home Medications on Admission:  Current Outpatient Prescriptions:  .  aspirin EC 81 MG tablet, Take 1 tablet (81 mg total) by mouth daily., Disp: 90 tablet, Rfl: 3 .  atorvastatin (LIPITOR) 80 MG tablet, Take 1 tablet (80 mg total) by mouth daily at 6 PM., Disp: 30 tablet, Rfl: 6 .  clopidogrel (PLAVIX) 75 MG tablet, Take 1 tablet (75 mg total) by mouth daily., Disp: 30 tablet, Rfl: 0 .  Clotrimazole (LOTRIMIN AF EX), Apply 1 application topically daily as needed (athlete's foot)., Disp: , Rfl:  .  fluticasone (FLONASE) 50 MCG/ACT nasal spray, Place 1 spray into both nostrils at bedtime as needed for allergies or rhinitis., Disp: , Rfl:  .  nitroGLYCERIN (NITROSTAT) 0.4 MG SL tablet, Place 1 tablet (0.4 mg total) under the tongue every 5 (five) minutes as needed for chest pain., Disp: 25 tablet, Rfl: prn  Past Medical History: Past Medical History:  Diagnosis Date  . Osteoarthritis    back  . Other specified cardiac dysrhythmias(427.89)    bradycardia  . Palpitations   . Paroxysmal supraventricular tachycardia (HCC)     Tobacco Use: History  Smoking Status  . Never Smoker  Smokeless Tobacco  . Never Used    Labs: Recent Review Flowsheet Data    Labs for ITP Cardiac and Pulmonary Rehab Latest Ref Rng & Units 02/24/2016   Cholestrol 125 - 200 mg/dL 409   LDLCALC <811 mg/dL 914   HDL >=78 mg/dL 54   Trlycerides <295 mg/dL 88      Capillary Blood Glucose: No results found  for: GLUCAP   Exercise Target Goals: Date: 03/25/17  Exercise Program Goal: Individual exercise prescription set with THRR, safety & activity barriers. Participant demonstrates ability to understand and report RPE using BORG scale, to self-measure pulse accurately, and to acknowledge the importance of the exercise prescription.  Exercise Prescription Goal: Starting with aerobic activity 30 plus minutes a day, 3 days per week for initial exercise prescription. Provide home exercise prescription and guidelines that participant acknowledges understanding prior to discharge.  Activity Barriers & Risk Stratification:     Activity Barriers & Cardiac Risk Stratification - 03/25/17 0843      Activity Barriers & Cardiac Risk Stratification   Activity Barriers Back Problems;Joint Problems   Cardiac Risk Stratification High      6 Minute Walk:     6 Minute Walk    Row Name 03/25/17 0936         6 Minute Walk   Phase Initial     Distance 1900 feet     Walk Time 6 minutes     # of Rest Breaks 0     MPH 3.6     METS 4.4     RPE 10     VO2 Peak 15.4     Symptoms No     Resting HR 51 bpm     Resting BP 136/78     Max  Ex. HR 80 bpm     Max Ex. BP 150/86     2 Minute Post BP 122/76        Oxygen Initial Assessment:   Oxygen Re-Evaluation:   Oxygen Discharge (Final Oxygen Re-Evaluation):   Initial Exercise Prescription:     Initial Exercise Prescription - 03/25/17 0900      Date of Initial Exercise RX and Referring Provider   Date 03/25/17   Referring Provider Donato Schultz MD     Treadmill   MPH 3.5   Grade 3   Minutes 10   METs 5.13     Bike   Level 1.2   Minutes 10   METs 3.4     Rower   Level 6   Watts 65   Minutes 10   METs 5.5     Prescription Details   Frequency (times per week) 3   Duration Progress to 45 minutes of aerobic exercise without signs/symptoms of physical distress     Intensity   THRR 40-80% of Max Heartrate 65-130   Ratings of  Perceived Exertion 11-15   Perceived Dyspnea 0-4     Progression   Progression Continue to progress workloads to maintain intensity without signs/symptoms of physical distress.     Resistance Training   Training Prescription Yes   Weight 5   Reps 10-15      Perform Capillary Blood Glucose checks as needed.  Exercise Prescription Changes:   Exercise Comments:   Exercise Goals and Review:     Exercise Goals    Row Name 03/25/17 0843             Exercise Goals   Increase Physical Activity Yes       Intervention Provide advice, education, support and counseling about physical activity/exercise needs.;Develop an individualized exercise prescription for aerobic and resistive training based on initial evaluation findings, risk stratification, comorbidities and participant's personal goals.       Expected Outcomes Achievement of increased cardiorespiratory fitness and enhanced flexibility, muscular endurance and strength shown through measurements of functional capacity and personal statement of participant.       Increase Strength and Stamina Yes       Intervention Develop an individualized exercise prescription for aerobic and resistive training based on initial evaluation findings, risk stratification, comorbidities and participant's personal goals.;Provide advice, education, support and counseling about physical activity/exercise needs.       Expected Outcomes Achievement of increased cardiorespiratory fitness and enhanced flexibility, muscular endurance and strength shown through measurements of functional capacity and personal statement of participant.          Exercise Goals Re-Evaluation :    Discharge Exercise Prescription (Final Exercise Prescription Changes):   Nutrition:  Target Goals: Understanding of nutrition guidelines, daily intake of sodium 1500mg , cholesterol 200mg , calories 30% from fat and 7% or less from saturated fats, daily to have 5 or more servings  of fruits and vegetables.  Biometrics:     Pre Biometrics - 03/25/17 0944      Pre Biometrics   Waist Circumference 38.5 inches   Hip Circumference 41.5 inches   Waist to Hip Ratio 0.93 %   Triceps Skinfold 18 mm   % Body Fat 28 %   Grip Strength 40 kg   Flexibility 8.5 in   Single Leg Stand 30 seconds       Nutrition Therapy Plan and Nutrition Goals:   Nutrition Discharge: Nutrition Scores:   Nutrition Goals Re-Evaluation:   Nutrition Goals  Re-Evaluation:   Nutrition Goals Discharge (Final Nutrition Goals Re-Evaluation):   Psychosocial: Target Goals: Acknowledge presence or absence of significant depression and/or stress, maximize coping skills, provide positive support system. Participant is able to verbalize types and ability to use techniques and skills needed for reducing stress and depression.  Initial Review & Psychosocial Screening:     Initial Psych Review & Screening - 03/25/17 1132      Initial Review   Current issues with Current Stress Concerns   Source of Stress Concerns Occupation;Family   Comments Pt works as a Corporate investment bankermanaging director, Secondary school teachercorporate advisory services; pt has 4 children     Family Dynamics   Good Support System? Yes     Barriers   Psychosocial barriers to participate in program The patient should benefit from training in stress management and relaxation.      Quality of Life Scores:     Quality of Life - 03/25/17 0916      Quality of Life Scores   Health/Function Pre 26.17 %   Socioeconomic Pre 26.64 %   Psych/Spiritual Pre 23.29 %   Family Pre 25.9 %   GLOBAL Pre 25.63 %      PHQ-9: Recent Review Flowsheet Data    There is no flowsheet data to display.     Interpretation of Total Score  Total Score Depression Severity:  1-4 = Minimal depression, 5-9 = Mild depression, 10-14 = Moderate depression, 15-19 = Moderately severe depression, 20-27 = Severe depression   Psychosocial Evaluation and  Intervention:   Psychosocial Re-Evaluation:   Psychosocial Discharge (Final Psychosocial Re-Evaluation):   Vocational Rehabilitation: Provide vocational rehab assistance to qualifying candidates.   Vocational Rehab Evaluation & Intervention:   Education: Education Goals: Education classes will be provided on a weekly basis, covering required topics. Participant will state understanding/return demonstration of topics presented.  Learning Barriers/Preferences:     Learning Barriers/Preferences - 03/25/17 0843      Learning Barriers/Preferences   Learning Barriers Sight   Learning Preferences Written Material;Verbal Instruction;Skilled Demonstration      Education Topics: Count Your Pulse:  -Group instruction provided by verbal instruction, demonstration, patient participation and written materials to support subject.  Instructors address importance of being able to find your pulse and how to count your pulse when at home without a heart monitor.  Patients get hands on experience counting their pulse with staff help and individually.   Heart Attack, Angina, and Risk Factor Modification:  -Group instruction provided by verbal instruction, video, and written materials to support subject.  Instructors address signs and symptoms of angina and heart attacks.    Also discuss risk factors for heart disease and how to make changes to improve heart health risk factors.   Functional Fitness:  -Group instruction provided by verbal instruction, demonstration, patient participation, and written materials to support subject.  Instructors address safety measures for doing things around the house.  Discuss how to get up and down off the floor, how to pick things up properly, how to safely get out of a chair without assistance, and balance training.   Meditation and Mindfulness:  -Group instruction provided by verbal instruction, patient participation, and written materials to support subject.   Instructor addresses importance of mindfulness and meditation practice to help reduce stress and improve awareness.  Instructor also leads participants through a meditation exercise.    Stretching for Flexibility and Mobility:  -Group instruction provided by verbal instruction, patient participation, and written materials to support subject.  Instructors lead  participants through series of stretches that are designed to increase flexibility thus improving mobility.  These stretches are additional exercise for major muscle groups that are typically performed during regular warm up and cool down.   Hands Only CPR:  -Group verbal, video, and participation provides a basic overview of AHA guidelines for community CPR. Role-play of emergencies allow participants the opportunity to practice calling for help and chest compression technique with discussion of AED use.   Hypertension: -Group verbal and written instruction that provides a basic overview of hypertension including the most recent diagnostic guidelines, risk factor reduction with self-care instructions and medication management.    Nutrition I class: Heart Healthy Eating:  -Group instruction provided by PowerPoint slides, verbal discussion, and written materials to support subject matter. The instructor gives an explanation and review of the Therapeutic Lifestyle Changes diet recommendations, which includes a discussion on lipid goals, dietary fat, sodium, fiber, plant stanol/sterol esters, sugar, and the components of a well-balanced, healthy diet.   Nutrition II class: Lifestyle Skills:  -Group instruction provided by PowerPoint slides, verbal discussion, and written materials to support subject matter. The instructor gives an explanation and review of label reading, grocery shopping for heart health, heart healthy recipe modifications, and ways to make healthier choices when eating out.   Diabetes Question & Answer:  -Group  instruction provided by PowerPoint slides, verbal discussion, and written materials to support subject matter. The instructor gives an explanation and review of diabetes co-morbidities, pre- and post-prandial blood glucose goals, pre-exercise blood glucose goals, signs, symptoms, and treatment of hypoglycemia and hyperglycemia, and foot care basics.   Diabetes Blitz:  -Group instruction provided by PowerPoint slides, verbal discussion, and written materials to support subject matter. The instructor gives an explanation and review of the physiology behind type 1 and type 2 diabetes, diabetes medications and rational behind using different medications, pre- and post-prandial blood glucose recommendations and Hemoglobin A1c goals, diabetes diet, and exercise including blood glucose guidelines for exercising safely.    Portion Distortion:  -Group instruction provided by PowerPoint slides, verbal discussion, written materials, and food models to support subject matter. The instructor gives an explanation of serving size versus portion size, changes in portions sizes over the last 20 years, and what consists of a serving from each food group.   Stress Management:  -Group instruction provided by verbal instruction, video, and written materials to support subject matter.  Instructors review role of stress in heart disease and how to cope with stress positively.     Exercising on Your Own:  -Group instruction provided by verbal instruction, power point, and written materials to support subject.  Instructors discuss benefits of exercise, components of exercise, frequency and intensity of exercise, and end points for exercise.  Also discuss use of nitroglycerin and activating EMS.  Review options of places to exercise outside of rehab.  Review guidelines for sex with heart disease.   Cardiac Drugs I:  -Group instruction provided by verbal instruction and written materials to support subject.  Instructor  reviews cardiac drug classes: antiplatelets, anticoagulants, beta blockers, and statins.  Instructor discusses reasons, side effects, and lifestyle considerations for each drug class.   Cardiac Drugs II:  -Group instruction provided by verbal instruction and written materials to support subject.  Instructor reviews cardiac drug classes: angiotensin converting enzyme inhibitors (ACE-I), angiotensin II receptor blockers (ARBs), nitrates, and calcium channel blockers.  Instructor discusses reasons, side effects, and lifestyle considerations for each drug class.   Anatomy and Physiology of  the Circulatory System:  Group verbal and written instruction and models provide basic cardiac anatomy and physiology, with the coronary electrical and arterial systems. Review of: AMI, Angina, Valve disease, Heart Failure, Peripheral Artery Disease, Cardiac Arrhythmia, Pacemakers, and the ICD.   Other Education:  -Group or individual verbal, written, or video instructions that support the educational goals of the cardiac rehab program.   Knowledge Questionnaire Score:     Knowledge Questionnaire Score - 03/25/17 0936      Knowledge Questionnaire Score   Pre Score 20/24      Core Components/Risk Factors/Patient Goals at Admission:     Personal Goals and Risk Factors at Admission - 03/25/17 1140      Core Components/Risk Factors/Patient Goals on Admission    Weight Management Weight Loss;Yes   Admit Weight 207 lb 3.7 oz (94 kg)   Goal Weight: Short Term 195 lb (88.5 kg)   Goal Weight: Long Term 190 lb (86.2 kg)   Stress Yes   Personal Goal Other Yes   Personal Goal Learn more about heart healthy eating/nutrition   Intervention Attend Tuesday nutrition classess   Expected Outcomes Pt will develop an improved understanding of heart healthy nutrition as evident by his food selection, choices and reaching desired weight.      Core Components/Risk Factors/Patient Goals Review:    Core  Components/Risk Factors/Patient Goals at Discharge (Final Review):    ITP Comments:     ITP Comments    Row Name 03/25/17 0842           ITP Comments Medical Director, Dr. Armanda Magic          Comments:  Patient attended orientation from 0800 to 0930 to review rules and guidelines for program. Completed 6 minute walk test, Intitial ITP, and exercise prescription.  VSS. Telemetry-SB/SR first degree with occ pvc.  Asymptomatic. Brief psychosocial assessment reveals stress concerns related to work.  Pt went to work earlier today for a hour before coming to the orientation appt.  Pt would benefit from stress management education along with meditation and mindfulness. Pt is looking forward to starting CR next Friday. Alanson Aly, BSN Cardiac and Emergency planning/management officer

## 2017-03-29 ENCOUNTER — Encounter (HOSPITAL_COMMUNITY): Payer: PRIVATE HEALTH INSURANCE

## 2017-03-31 ENCOUNTER — Encounter (HOSPITAL_COMMUNITY): Payer: PRIVATE HEALTH INSURANCE

## 2017-04-02 ENCOUNTER — Encounter (HOSPITAL_COMMUNITY)
Admission: RE | Admit: 2017-04-02 | Discharge: 2017-04-02 | Disposition: A | Discharge status: Home / Self Care | Payer: PRIVATE HEALTH INSURANCE | Source: Ambulatory Visit | Attending: Cardiology | Admitting: Cardiology

## 2017-04-02 DIAGNOSIS — Z48812 Encounter for surgical aftercare following surgery on the circulatory system: Secondary | ICD-10-CM | POA: Diagnosis not present

## 2017-04-02 DIAGNOSIS — Z955 Presence of coronary angioplasty implant and graft: Secondary | ICD-10-CM

## 2017-04-02 NOTE — Progress Notes (Signed)
Daily Session Note  Patient Details  Name: Colton Davidson MRN: [id number] Date of Birth: 1956-08-30 Referring Provider:     [name] from 03/25/2017 in [place]  Referring Provider  Candee Furbish MD      Encounter Date: 04/02/2017  Check In:     Session Check In - 04/02/17 0720      Check-In   Location [facility]   Staff Present Su Hilt, MS, ACSM RCEP, Exercise Physiologist;Ashley Armstrong, MS, Exercise Physiologist;Numan Zylstra Wilber Oliphant, RN, BSN   Supervising physician immediately available to respond to emergencies [facility] immediately available   Physician(s) Dr. Sloan Leiter   Medication changes reported     No   Fall or balance concerns reported    No   Tobacco Cessation No Change   Warm-up and Cool-down Performed as group-led instruction   Resistance Training Performed Yes   VAD Patient? No     Pain Assessment   Currently in Pain? No/denies   Multiple Pain Sites No      Capillary Blood Glucose: No results found for this or any previous visit (from the past 24 hour(s)).    History  Smoking Status  . Never Smoker  Smokeless Tobacco  . Never Used    Goals Met:  Exercise tolerated well Personal goals reviewed No report of cardiac concerns or symptoms  Goals Unmet:  Not Applicable  Comments: Pt started full exercise in  cardiac rehab today.  Pt tolerated light exercise without difficulty. VSS, telemetry-SR, asymptomatic.  Medication list reconciled. Pt denies barriers to medication compliance.  PSYCHOSOCIAL ASSESSMENT:  PHQ-0. Pt exhibits positive coping skills, hopeful outlook with supportive family. Pt has some stress related to his work.  Pt typically gets up at 5 and works from home prior to getting ready to go to work. Encouraged pt to attend stress management and meditation class.  Pt completed pre assessment quality of life survey. Pt scored the following     Quality  of Life - 03/25/17 0916      Quality of Life Scores   Health/Function Pre 26.17 %   Socioeconomic Pre 26.64 %   Psych/Spiritual Pre 23.29 %   Family Pre 25.9 %   GLOBAL Pre 25.63 %     No psychosocial needs identified at this time, no psychosocial interventions necessary.    Pt enjoys playing soccer, outdoor activities such as hiking and fly fishing. Pt desires to learn more about heart healthy eating.  Pt plans to attend the nutrition classes on Tuesdays.  Pt long term goal is to to lose 10 -15 pounds with a goal weight of 190 -195 pounds. Pt oriented to exercise equipment and routine.Understanding verbalized. [name] RN, BSN Cardiac and Pulmonary Rehab Nurse Navigator      Dr. Fransico Him is Medical Director for Cardiac Rehab at [facility].

## 2017-04-05 ENCOUNTER — Encounter (HOSPITAL_COMMUNITY)
Admission: RE | Admit: 2017-04-05 | Discharge: 2017-04-05 | Disposition: A | Discharge status: Home / Self Care | Payer: PRIVATE HEALTH INSURANCE | Source: Ambulatory Visit | Attending: Cardiology | Admitting: Cardiology

## 2017-04-05 DIAGNOSIS — Z48812 Encounter for surgical aftercare following surgery on the circulatory system: Secondary | ICD-10-CM | POA: Diagnosis not present

## 2017-04-05 DIAGNOSIS — Z955 Presence of coronary angioplasty implant and graft: Secondary | ICD-10-CM

## 2017-04-06 NOTE — Progress Notes (Signed)
Cardiac Individual Treatment Plan  Patient Details  Name: Colton Davidson MRN: [id number] Date of Birth: March 27, 1956 Referring Provider:     CARDIAC REHAB PHASE II OR[facility]ATION [facility]2017 [facility]  Referring Provider  Donato SchultzSkains, Mark MD      Initial Encounter Date:    CARD[facility]  Date  03/25/17  Referring Provider  Donato SchultzSkains, Mark MD      Visit Diagnosis: 03/04/17 DES LAD  Patient's Home Medications on Admission:  Current Outpatient Prescriptions:  .  aspirin EC 81 MG tablet, Take 1 tablet (81 mg total) by mouth daily., Disp: 90 tablet, Rfl: 3 .  atorvastatin (LIPITOR) 80 MG tablet, Take 1 tablet (80 mg total) by mouth daily at 6 PM., Disp: 30 tablet, Rfl: 6 .  clopidogrel (PLAVIX) 75 MG tablet, Take 1 tablet (75 mg total) by mouth daily., Disp: 30 tablet, Rfl: 0 .  Clotrimazole (LOTRIMIN AF EX), Apply 1 application topically daily as needed (athlete's foot)., Disp: , Rfl:  .  fluticasone (FLONASE) 50 MCG/ACT nasal spray, Place 1 spray into both nostrils at bedtime as needed for allergies or rhinitis., Disp: , Rfl:  .  nitroGLYCERIN (NITROSTAT) 0.4 MG SL tablet, Place 1 tablet (0.4 mg total) under the tongue every 5 (five) minutes as needed for chest pain., Disp: 25 tablet, Rfl: prn  Past Medical History: Past Medical History:  Diagnosis Date  . Osteoarthritis    back  . Other specified cardiac dysrhythmias(427.89)    bradycardia  . Palpitations   . Paroxysmal supraventricular tachycardia (HCC)     Tobacco Use: History  Smoking Status  . Never Smoker  Smokeless Tobacco  . Never Used    Labs: Recent Review Flowsheet Data    Labs for ITP Cardiac and Pulmonary Rehab Latest Ref Rng & Units 02/24/2016   Cholestrol 125 - 200 mg/dL 409177   LDLCALC <811<130 mg/dL 914105   HDL >=78>=40 mg/dL 54   Trlycerides <295<150 mg/dL 88      Capillary Blood Glucose: No results found  for: GLUCAP   Exercise Target Goals:    Exercise Program Goal: Individual exercise prescription set with THRR, safety & activity barriers. Participant demonstrates ability to understand and report RPE using BORG scale, to self-measure pulse accurately, and to acknowledge the importance of the exercise prescription.  Exercise Prescription Goal: Starting with aerobic activity 30 plus minutes a day, 3 days per week for initial exercise prescription. Provide home exercise prescription and guidelines that participant acknowledges understanding prior to discharge.  Activity Barriers & Risk Stratification:     Activity Barriers & Cardiac Risk Stratification - 03/25/17 0843      Activity Barriers & Cardiac Risk Stratification   Activity Barriers Back Problems;Joint Problems   Cardiac Risk Stratification High      6 Minute Walk:     6 Minute Walk    Row Name 03/25/17 0936         6 Minute Walk   Phase Initial     Distance 1900 feet     Walk Time 6 minutes     # of Rest Breaks 0     MPH 3.6     METS 4.4     RPE 10     VO2 Peak 15.4     Symptoms No     Resting HR 51 bpm     Resting BP 136/78     Max  Ex. HR 80 bpm     Max Ex. BP 150/86     2 Minute Post BP 122/76        Oxygen Initial Assessment:   Oxygen Re-Evaluation:   Oxygen Discharge (Final Oxygen Re-Evaluation):   Initial Exercise Prescription:     Initial Exercise Prescription - 03/25/17 0900      Date of Initial Exercise RX and Referring Provider   Date 03/25/17   Referring Provider Donato Schultz MD     Treadmill   MPH 3.5   Grade 3   Minutes 10   METs 5.13     Bike   Level 1.2   Minutes 10   METs 3.4     Rower   Level 6   Watts 65   Minutes 10   METs 5.5     Prescription Details   Frequency (times per week) 3   Duration Progress to 45 minutes of aerobic exercise without signs/symptoms of physical distress     Intensity   THRR 40-80% of Max Heartrate 65-130   Ratings of Perceived  Exertion 11-15   Perceived Dyspnea 0-4     Progression   Progression Continue to progress workloads to maintain intensity without signs/symptoms of physical distress.     Resistance Training   Training Prescription Yes   Weight 5   Reps 10-15      Perform Capillary Blood Glucose checks as needed.  Exercise Prescription Changes:   Exercise Comments:     Exercise Comments    Row Name 04/05/17 0910           Exercise Comments Pt is off to a great start with exercise.           Exercise Goals and Review:     Exercise Goals    Row Name 03/25/17 0843             Exercise Goals   Increase Physical Activity Yes       Intervention Provide advice, education, support and counseling about physical activity/exercise needs.;Develop an individualized exercise prescription for aerobic and resistive training based on initial evaluation findings, risk stratification, comorbidities and participant's personal goals.       Expected Outcomes Achievement of increased cardiorespiratory fitness and enhanced flexibility, muscular endurance and strength shown through measurements of functional capacity and personal statement of participant.       Increase Strength and Stamina Yes       Intervention Develop an individualized exercise prescription for aerobic and resistive training based on initial evaluation findings, risk stratification, comorbidities and participant's personal goals.;Provide advice, education, support and counseling about physical activity/exercise needs.       Expected Outcomes Achievement of increased cardiorespiratory fitness and enhanced flexibility, muscular endurance and strength shown through measurements of functional capacity and personal statement of participant.          Exercise Goals Re-Evaluation :     Exercise Goals Re-Evaluation    Row Name 04/05/17 0908             Exercise Goal Re-Evaluation   Exercise Goals Review Increase Physical  Activity;Increase Strenth and Stamina       Comments Pt is off to a great start with exercise and he is currently [facility]ericse at the [facility] 3-4 days/week for about 1 hour.       Expected Outcomes continue with exercise Rx and home exercise program and increase workloads as tolerated in order to increase cardiorespiratory fitness  Discharge Exercise Prescription (Final Exercise Prescription Changes):   Nutrition:  Target Goals: Understanding of nutrition guidelines, daily intake of sodium 1500mg , cholesterol 200mg , calories 30% from fat and 7% or less from saturated fats, daily to have 5 or more servings of fruits and vegetables.  Biometrics:     Pre Biometrics - 03/25/17 0944      Pre Biometrics   Waist Circumference 38.5 inches   Hip Circumference 41.5 inches   Waist to Hip Ratio 0.93 %   Triceps Skinfold 18 mm   % Body Fat 28 %   Grip Strength 40 kg   Flexibility 8.5 in   Single Leg Stand 30 seconds       Nutrition Therapy Plan and Nutrition Goals:   Nutrition Discharge: Nutrition Scores:   Nutrition Goals Re-Evaluation:   Nutrition Goals Re-Evaluation:   Nutrition Goals Discharge (Final Nutrition Goals Re-Evaluation):   Psychosocial: Target Goals: Acknowledge presence or absence of significant depression and/or stress, maximize coping skills, provide positive support system. Participant is able to verbalize types and ability to use techniques and skills needed for reducing stress and depression.  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 Yes     Barriers   Psychosocial barriers to participate in program The patient should benefit from training  in stress management and relaxation.      Quality of Life Scores:     Quality of Life - 03/25/17 0916      Quality of Life Scores   Health/Function Pre 26.17 %   Socioeconomic Pre 26.64 %   Psych/Spiritual Pre 23.29 %   Family Pre 25.9 %   GLOBAL Pre 25.63 %      PHQ-9: Recent Review Flowsheet Data    Depression screen [facility] 2/9 04/02/2017   Decreased Interest 0   Down, Depressed, Hopeless 0   PHQ - 2 Score 0     Interpretation of Total Score  Total Score Depression Severity:  1-4 = Minimal depression, 5-9 = Mild depression, 10-14 = Moderate depression, 15-19 = Moderately severe depression, 20-27 = Severe depression   Psychosocial Evaluation and Intervention:     Psychosocial Evaluation - 04/02/17 0839      Psychosocial Evaluation & Interventions   Interventions Stress management education   Continue Psychosocial Services  Follow up required by staff      Psychosocial Re-Evaluation:     Psychosocial Re-Evaluation    Row Name 04/06/17 0730             Psychosocial Re-Evaluation   Current issues with Current Stress Concerns       Comments pt with high stress career.  stress management education is encouraged.        Expected Outcomes pt will demonstrate positive outlook with good coping skills.        Interventions Stress management education;Relaxation education;Encouraged to attend Cardiac Rehabilitation for the exercise       Continue Psychosocial Services  Follow up required by staff          Psychosocial Discharge (Final Psychosocial Re-Evaluation):     Psychosocial Re-Evaluation - 04/06/17 0730      Psychosocial Re-Evaluation   Current  issues with Current Stress Concerns   Comments pt with high stress career.  stress management education is encouraged.    Expected Outcomes pt will demonstrate positive outlook with good coping skills.    Interventions Stress management education;Relaxation education;Encouraged to attend Cardiac Rehabilitation for  the exercise   Continue Psychosocial Services  Follow up required by staff      Vocational Rehabilitation: Provide vocational rehab assistance to qualifying candidates.   Vocational Rehab Evaluation & Intervention:     Vocational Rehab - 03/25/17 1206      Initial Vocational Rehab Evaluation & Intervention   Assessment shows need for Vocational Rehabilitation No      Education: Education Goals: Education classes will be provided on a weekly basis, covering required topics. Participant will state understanding/return demonstration of topics presented.  Learning Barriers/Preferences:     Learning Barriers/Preferences - 03/25/17 0843      Learning Barriers/Preferences   Learning Barriers Sight   Learning Preferences Written Material;Verbal Instruction;Skilled Demonstration      Education Topics: Count Your Pulse:  -Group instruction provided by verbal instruction, demonstration, patient participation and written materials to support subject.  Instructors address importance of being able to find your pulse and how to count your pulse when at home without a heart monitor.  Patients get hands on experience counting their pulse with staff help and individually.   Heart Attack, Angina, and Risk Factor Modification:  -Group instruction provided by verbal instruction, video, and written materials to support subject.  Instructors address signs and symptoms of angina and heart attacks.    Also discuss risk factors for heart disease and how to make changes to improve heart health risk factors.   Functional Fitness:  -Group instruction provided by verbal instruction, demonstration, patient participation, and written materials to support subject.  Instructors address safety measures for doing things around the house.  Discuss how to get up and down off the floor, how to pick things up properly, how to safely get out of a chair without assistance, and balance training.   Meditation and  Mindfulness:  -Group instruction provided by verbal instruction, patient participation, and written materials to support subject.  Instructor addresses importance of mindfulness and meditation practice to help reduce stress and improve awareness.  Instructor also leads participants through a meditation exercise.    Stretching for Flexibility and Mobility:  -Group instruction provided by verbal instruction, patient participation, and written materials to support subject.  Instructors lead participants through series of stretches that are designed to increase flexibility thus improving mobility.  These stretches are additional exercise for major muscle groups that are typically performed during regular warm up and cool down.   Hands Only CPR:  -Group verbal, video, and participation provides a basic overview of AHA guidelines for community CPR. Role-play of emergencies allow participants the opportunity to practice calling for help and chest compression technique with discussion of AED use.   Hypertension: -Group verbal and written instruction that provides a basic overview of hypertension including the most recent diagnostic guidelines, risk factor reduction with self-care instructions and medication management.    Nutrition I class: Heart Healthy Eating:  -Group instruction provided by PowerPoint slides, verbal discussion, and written materials to support subject matter. The instructor gives an explanation and review of the Therapeutic Lifestyle Changes diet recommendations, which includes a discussion on lipid goals, dietary fat, sodium, fiber, plant stanol/sterol esters, sugar, and the components of a well-balanced, healthy diet.   Nutrition II class: Lifestyle Skills:  -Group  instruction provided by PowerPoint slides, verbal discussion, and written materials to support subject matter. The instructor gives an explanation and review of label reading, grocery shopping for heart health, heart  healthy recipe modifications, and ways to make healthier choices when eating out.   Diabetes Question & Answer:  -Group instruction provided by PowerPoint slides, verbal discussion, and written materials to support subject matter. The instructor gives an explanation and review of diabetes co-morbidities, pre- and post-prandial blood glucose goals, pre-exercise blood glucose goals, signs, symptoms, and treatment of hypoglycemia and hyperglycemia, and foot care basics.   Diabetes Blitz:  -Group instruction provided by PowerPoint slides, verbal discussion, and written materials to support subject matter. The instructor gives an explanation and review of the physiology behind type 1 and type 2 diabetes, diabetes medications and rational behind using different medications, pre- and post-prandial blood glucose recommendations and Hemoglobin A1c goals, diabetes diet, and exercise including blood glucose guidelines for exercising safely.    Portion Distortion:  -Group instruction provided by PowerPoint slides, verbal discussion, written materials, and food models to support subject matter. The instructor gives an explanation of serving size versus portion size, changes in portions sizes over the last 20 years, and what consists of a serving from each food group.   Stress Management:  -Group instruction provided by verbal instruction, video, and written materials to support subject matter.  Instructors review role of stress in heart disease and how to cope with stress positively.     Exercising on Your Own:  -Group instruction provided by verbal instruction, power point, and written materials to support subject.  Instructors discuss benefits of exercise, components of exercise, frequency and intensity of exercise, and end points for exercise.  Also discuss use of nitroglycerin and activating EMS.  Review options of places to exercise outside of rehab.  Review guidelines for sex with heart  disease.   Cardiac Drugs I:  -Group instruction provided by verbal instruction and written materials to support subject.  Instructor reviews cardiac drug classes: antiplatelets, anticoagulants, beta blockers, and statins.  Instructor discusses reasons, side effects, and lifestyle considerations for each drug class.   Cardiac Drugs II:  -Group instruction provided by verbal instruction and written materials to support subject.  Instructor reviews cardiac drug classes: angiotensin converting enzyme inhibitors (ACE-I), angiotensin II receptor blockers (ARBs), nitrates, and calcium channel blockers.  Instructor discusses reasons, side effects, and lifestyle considerations for each drug class.   Anatomy and Physiology of the Circulatory System:  Group verbal and written instruction and models provide basic cardiac anatomy and physiology, with the coronary electrical and arterial systems. Review of: AMI, Angina, Valve disease, Heart Failure, Peripheral Artery Disease, Cardiac Arrhythmia, Pacemakers, and the ICD.   Other Education:  -Group or individual verbal, written, or video instructions that support the educational goals of the cardiac rehab program.   Knowledge Questionnaire Score:     Knowledge Questionnaire Score - 03/25/17 0936      Knowledge Questionnaire Score   Pre Score 20/24      Core Components/Risk Factors/Patient Goals at Admission:     Personal Goals and Risk Factors at Admission - 03/25/17 1140      Core Components/Risk Factors/Patient Goals on Admission    Weight Management Weight Loss;Yes   Admit Weight 207 lb 3.7 oz (94 kg)   Goal Weight: Short Term 195 lb (88.5 kg)   Goal Weight: Long Term 190 lb (86.2 kg)   Stress Yes   Personal Goal Other Yes   Personal  Goal Learn more about heart healthy eating/nutrition   Intervention Attend Tuesday nutrition classess   Expected Outcomes Pt will develop an improved understanding of heart healthy nutrition as evident by  his food selection, choices and reaching desired weight.      Core Components/Risk Factors/Patient Goals Review:      Goals and Risk Factor Review    Row Name 04/06/17 0728             Core Components/Risk Factors/Patient Goals Review   Personal Goals Review Weight Management/Obesity;Other;Stress       Review pt demonstrates eagerness to participate in CR activities. pt expressed personal goal of learning heart healthy nutrition. pt has been encouraged to participate in Tuesday nutrition classes.        Expected Outcomes pt will participate in CR exercise, nutrition, and lifestyle education opportunities to decrease overall CAD risk factors.           Core Components/Risk Factors/Patient Goals at Discharge (Final Review):      Goals and Risk Factor Review - 04/06/17 0728      Core Components/Risk Factors/Patient Goals Review   Personal Goals Review Weight Management/Obesity;Other;Stress   Review pt demonstrates eagerness to participate in CR activities. pt expressed personal goal of learning heart healthy nutrition. pt has been encouraged to participate in Tuesday nutrition classes.    Expected Outcomes pt will participate in CR exercise, nutrition, and lifestyle education opportunities to decrease overall CAD risk factors.       ITP Comments:     ITP Comments    Row Name 03/25/17 [phone] 04/06/17 0727         ITP Comments Medical Director, Dr. Armanda Magic Medical Director, Dr. Armanda Magic         Comments: Pt is making expected progress toward personal goals after completing 3 sessions. Recommend continued exercise and life style modification education including  stress management and relaxation techniques to decrease cardiac risk profile.

## 2017-04-07 ENCOUNTER — Encounter: Payer: Self-pay | Admitting: Cardiology

## 2017-04-07 ENCOUNTER — Encounter (HOSPITAL_COMMUNITY)
Admission: RE | Admit: 2017-04-07 | Discharge: 2017-04-07 | Disposition: A | Discharge status: Home / Self Care | Payer: PRIVATE HEALTH INSURANCE | Source: Ambulatory Visit | Attending: Cardiology | Admitting: Cardiology

## 2017-04-07 DIAGNOSIS — Z48812 Encounter for surgical aftercare following surgery on the circulatory system: Secondary | ICD-10-CM | POA: Diagnosis not present

## 2017-04-07 DIAGNOSIS — Z955 Presence of coronary angioplasty implant and graft: Secondary | ICD-10-CM

## 2017-04-08 ENCOUNTER — Other Ambulatory Visit: Payer: Self-pay | Admitting: *Deleted

## 2017-04-08 MED ORDER — ASPIRIN EC 81 MG PO TBEC: 81.0000 mg | DELAYED_RELEASE_TABLET | Freq: Every day | ORAL | 11 refills | Status: AC

## 2017-04-09 ENCOUNTER — Encounter (HOSPITAL_COMMUNITY): Payer: PRIVATE HEALTH INSURANCE

## 2017-04-12 ENCOUNTER — Encounter (HOSPITAL_COMMUNITY)
Admission: RE | Admit: 2017-04-12 | Discharge: 2017-04-12 | Disposition: A | Discharge status: Home / Self Care | Payer: PRIVATE HEALTH INSURANCE | Source: Ambulatory Visit | Attending: Cardiology | Admitting: Cardiology

## 2017-04-12 DIAGNOSIS — Z955 Presence of coronary angioplasty implant and graft: Secondary | ICD-10-CM

## 2017-04-12 DIAGNOSIS — Z48812 Encounter for surgical aftercare following surgery on the circulatory system: Secondary | ICD-10-CM | POA: Diagnosis not present

## 2017-04-14 ENCOUNTER — Encounter (HOSPITAL_COMMUNITY)
Admission: RE | Admit: 2017-04-14 | Discharge: 2017-04-14 | Disposition: A | Discharge status: Home / Self Care | Payer: PRIVATE HEALTH INSURANCE | Source: Ambulatory Visit | Attending: Cardiology | Admitting: Cardiology

## 2017-04-14 DIAGNOSIS — Z48812 Encounter for surgical aftercare following surgery on the circulatory system: Secondary | ICD-10-CM | POA: Diagnosis not present

## 2017-04-14 DIAGNOSIS — Z955 Presence of coronary angioplasty implant and graft: Secondary | ICD-10-CM

## 2017-04-16 ENCOUNTER — Encounter (HOSPITAL_COMMUNITY)
Admission: RE | Admit: 2017-04-16 | Discharge: 2017-04-16 | Disposition: A | Discharge status: Home / Self Care | Payer: PRIVATE HEALTH INSURANCE | Source: Ambulatory Visit | Attending: Cardiology | Admitting: Cardiology

## 2017-04-16 DIAGNOSIS — Z955 Presence of coronary angioplasty implant and graft: Secondary | ICD-10-CM

## 2017-04-16 DIAGNOSIS — Z48812 Encounter for surgical aftercare following surgery on the circulatory system: Secondary | ICD-10-CM | POA: Diagnosis not present

## 2017-04-19 ENCOUNTER — Encounter (HOSPITAL_COMMUNITY)
Admission: RE | Admit: 2017-04-19 | Discharge: 2017-04-19 | Disposition: A | Discharge status: Home / Self Care | Payer: PRIVATE HEALTH INSURANCE | Source: Ambulatory Visit | Attending: Cardiology | Admitting: Cardiology

## 2017-04-19 DIAGNOSIS — Z955 Presence of coronary angioplasty implant and graft: Secondary | ICD-10-CM | POA: Insufficient documentation

## 2017-04-19 DIAGNOSIS — Z48812 Encounter for surgical aftercare following surgery on the circulatory system: Secondary | ICD-10-CM | POA: Diagnosis present

## 2017-04-23 ENCOUNTER — Encounter (HOSPITAL_COMMUNITY)
Admission: RE | Admit: 2017-04-23 | Discharge: 2017-04-23 | Disposition: A | Discharge status: Home / Self Care | Payer: PRIVATE HEALTH INSURANCE | Source: Ambulatory Visit | Attending: Cardiology | Admitting: Cardiology

## 2017-04-23 DIAGNOSIS — Z955 Presence of coronary angioplasty implant and graft: Secondary | ICD-10-CM

## 2017-04-23 DIAGNOSIS — Z48812 Encounter for surgical aftercare following surgery on the circulatory system: Secondary | ICD-10-CM | POA: Diagnosis not present

## 2017-04-26 ENCOUNTER — Encounter (HOSPITAL_COMMUNITY): Payer: PRIVATE HEALTH INSURANCE

## 2017-04-28 ENCOUNTER — Encounter (HOSPITAL_COMMUNITY): Payer: PRIVATE HEALTH INSURANCE

## 2017-04-30 ENCOUNTER — Encounter (HOSPITAL_COMMUNITY)
Admission: RE | Admit: 2017-04-30 | Discharge: 2017-04-30 | Disposition: A | Discharge status: Home / Self Care | Payer: PRIVATE HEALTH INSURANCE | Source: Ambulatory Visit | Attending: Cardiology | Admitting: Cardiology

## 2017-04-30 DIAGNOSIS — Z48812 Encounter for surgical aftercare following surgery on the circulatory system: Secondary | ICD-10-CM | POA: Diagnosis not present

## 2017-04-30 DIAGNOSIS — Z955 Presence of coronary angioplasty implant and graft: Secondary | ICD-10-CM

## 2017-05-03 ENCOUNTER — Encounter (HOSPITAL_COMMUNITY)
Admission: RE | Admit: 2017-05-03 | Discharge: 2017-05-03 | Disposition: A | Discharge status: Home / Self Care | Payer: PRIVATE HEALTH INSURANCE | Source: Ambulatory Visit | Attending: Cardiology | Admitting: Cardiology

## 2017-05-03 DIAGNOSIS — Z955 Presence of coronary angioplasty implant and graft: Secondary | ICD-10-CM

## 2017-05-03 DIAGNOSIS — Z48812 Encounter for surgical aftercare following surgery on the circulatory system: Secondary | ICD-10-CM | POA: Diagnosis not present

## 2017-05-04 NOTE — Progress Notes (Signed)
Cardiac Individual Treatment Plan  Patient Details  Name: Colton Davidson MRN: [id number] Date of Birth: April 02, 1956 Referring Provider:     CARDIAC REHAB PHASE II ORIENTATION from 03/25/2017 in [facility] CARDIAC REHAB  Referring Provider  Donato Schultz MD      Initial Encounter Date:    CARDIAC REHAB PHASE II ORIENTATION from 03/25/2017 in [facility] CARDIAC REHAB  Date  03/25/17  Referring Provider  Donato Schultz MD      Visit Diagnosis: 03/04/17 DES LAD  Patient's Home Medications on Admission:  Current Outpatient Prescriptions:  .  aspirin EC 81 MG tablet, Take 1 tablet (81 mg total) by mouth daily., Disp: 30 tablet, Rfl: 11 .  atorvastatin (LIPITOR) 80 MG tablet, Take 1 tablet (80 mg total) by mouth daily at 6 PM., Disp: 30 tablet, Rfl: 6 .  clopidogrel (PLAVIX) 75 MG tablet, Take 1 tablet (75 mg total) by mouth daily., Disp: 30 tablet, Rfl: 0 .  Clotrimazole (LOTRIMIN AF EX), Apply 1 application topically daily as needed (athlete's foot)., Disp: , Rfl:  .  fluticasone (FLONASE) 50 MCG/ACT nasal spray, Place 1 spray into both nostrils at bedtime as needed for allergies or rhinitis., Disp: , Rfl:  .  nitroGLYCERIN (NITROSTAT) 0.4 MG SL tablet, Place 1 tablet (0.4 mg total) under the tongue every 5 (five) minutes as needed for chest pain., Disp: 25 tablet, Rfl: prn  Past Medical History: Past Medical History:  Diagnosis Date  . Osteoarthritis    back  . Other specified cardiac dysrhythmias(427.89)    bradycardia  . Palpitations   . Paroxysmal supraventricular tachycardia (HCC)     Tobacco Use: History  Smoking Status  . Never Smoker  Smokeless Tobacco  . Never Used    Labs: Recent Review Flowsheet Data    Labs for ITP Cardiac and Pulmonary Rehab Latest Ref Rng & Units 02/24/2016   Cholestrol 125 - 200 mg/dL 865   LDLCALC <784 mg/dL 696   HDL >=29 mg/dL 54   Trlycerides <528 mg/dL 88      Capillary Blood Glucose: No results  found for: GLUCAP   Exercise Target Goals:    Exercise Program Goal: Individual exercise prescription set with THRR, safety & activity barriers. Participant demonstrates ability to understand and report RPE using BORG scale, to self-measure pulse accurately, and to acknowledge the importance of the exercise prescription.  Exercise Prescription Goal: Starting with aerobic activity 30 plus minutes a day, 3 days per week for initial exercise prescription. Provide home exercise prescription and guidelines that participant acknowledges understanding prior to discharge.  Activity Barriers & Risk Stratification:     Activity Barriers & Cardiac Risk Stratification - 03/25/17 0843      Activity Barriers & Cardiac Risk Stratification   Activity Barriers Back Problems;Joint Problems   Cardiac Risk Stratification High      6 Minute Walk:     6 Minute Walk    Row Name 03/25/17 0936         6 Minute Walk   Phase Initial     Distance 1900 feet     Walk Time 6 minutes     # of Rest Breaks 0     MPH 3.6     METS 4.4     RPE 10     VO2 Peak 15.4     Symptoms No     Resting HR 51 bpm     Resting BP 136/78     Max  Ex. HR 80 bpm     Max Ex. BP 150/86     2 Minute Post BP 122/76        Oxygen Initial Assessment:   Oxygen Re-Evaluation:   Oxygen Discharge (Final Oxygen Re-Evaluation):   Initial Exercise Prescription:     Initial Exercise Prescription - 03/25/17 0900      Date of Initial Exercise RX and Referring Provider   Date 03/25/17   Referring Provider Donato Schultz MD     Treadmill   MPH 3.5   Grade 3   Minutes 10   METs 5.13     Bike   Level 1.2   Minutes 10   METs 3.4     Rower   Level 6   Watts 65   Minutes 10   METs 5.5     Prescription Details   Frequency (times per week) 3   Duration Progress to 45 minutes of aerobic exercise without signs/symptoms of physical distress     Intensity   THRR 40-80% of Max Heartrate 65-130   Ratings of  Perceived Exertion 11-15   Perceived Dyspnea 0-4     Progression   Progression Continue to progress workloads to maintain intensity without signs/symptoms of physical distress.     Resistance Training   Training Prescription Yes   Weight 5   Reps 10-15      Perform Capillary Blood Glucose checks as needed.  Exercise Prescription Changes:     Exercise Prescription Changes    Row Name 04/28/17 0800             Response to Exercise   Blood Pressure (Admit) (P)  118/80       Blood Pressure (Exercise) (P)  150/88       Blood Pressure (Exit) (P)  112/68       Heart Rate (Admit) (P)  67 bpm       Heart Rate (Exercise) (P)  111 bpm       Heart Rate (Exit) (P)  66 bpm       Rating of Perceived Exertion (Exercise) (P)  13       Duration (P)  Progress to 45 minutes of aerobic exercise without signs/symptoms of physical distress       Intensity (P)  THRR unchanged         Progression   Progression (P)  Continue to progress workloads to maintain intensity without signs/symptoms of physical distress.       Average METs (P)  6.6         Resistance Training   Training Prescription (P)  Yes       Weight (P)  8lbs       Reps (P)  10-15         Treadmill   MPH (P)  3.5       Grade (P)  3       Minutes (P)  10       METs (P)  5.13         Bike   Level (P)  3.2       Minutes (P)  10       METs (P)  7.39         Rower   Level (P)  6       Watts (P)  110       Minutes (P)  10       METs (P)  7.5  Home Exercise Plan   Plans to continue exercise at (P)  Home (comment)       Frequency (P)  Add 3 additional days to program exercise sessions.          Exercise Comments:     Exercise Comments    Row Name 04/05/17 0910 04/28/17 1353         Exercise Comments Pt is off to a great start with exercise.  Pt continues to do well with exercise          Exercise Goals and Review:     Exercise Goals    Row Name 03/25/17 0843             Exercise Goals    Increase Physical Activity Yes       Intervention Provide advice, education, support and counseling about physical activity/exercise needs.;Develop an individualized exercise prescription for aerobic and resistive training based on initial evaluation findings, risk stratification, comorbidities and participant's personal goals.       Expected Outcomes Achievement of increased cardiorespiratory fitness and enhanced flexibility, muscular endurance and strength shown through measurements of functional capacity and personal statement of participant.       Increase Strength and Stamina Yes       Intervention Develop an individualized exercise prescription for aerobic and resistive training based on initial evaluation findings, risk stratification, comorbidities and participant's personal goals.;Provide advice, education, support and counseling about physical activity/exercise needs.       Expected Outcomes Achievement of increased cardiorespiratory fitness and enhanced flexibility, muscular endurance and strength shown through measurements of functional capacity and personal statement of participant.          Exercise Goals Re-Evaluation :     Exercise Goals Re-Evaluation    Row Name 04/05/17 0908 04/28/17 1353           Exercise Goal Re-Evaluation   Exercise Goals Review Increase Physical Activity;Increase Strenth and Stamina Increase Physical Activity;Increase Strenth and Stamina      Comments Pt is off to a great start with exercise and he is currently exericse at the downtown [facility] 3-4 days/week for about 1 hour. Pt plays soccer 2xs/week and exercises at the [facility] 3-4 days/week      Expected Outcomes continue with exercise Rx and home exercise program and increase workloads as tolerated in order to increase cardiorespiratory fitness continue with exercise Rx and home exercise program and increase workloads as tolerated in order to increase cardiorespiratory fitness          Discharge Exercise  Prescription (Final Exercise Prescription Changes):     Exercise Prescription Changes - 04/28/17 0800      Response to Exercise   Blood Pressure (Admit) (P)  118/80   Blood Pressure (Exercise) (P)  150/88   Blood Pressure (Exit) (P)  112/68   Heart Rate (Admit) (P)  67 bpm   Heart Rate (Exercise) (P)  111 bpm   Heart Rate (Exit) (P)  66 bpm   Rating of Perceived Exertion (Exercise) (P)  13   Duration (P)  Progress to 45 minutes of aerobic exercise without signs/symptoms of physical distress   Intensity (P)  THRR unchanged     Progression   Progression (P)  Continue to progress workloads to maintain intensity without signs/symptoms of physical distress.   Average METs (P)  6.6     Resistance Training   Training Prescription (P)  Yes   Weight (P)  8lbs   Reps (P)  10-15  Treadmill   MPH (P)  3.5   Grade (P)  3   Minutes (P)  10   METs (P)  5.13     Bike   Level (P)  3.2   Minutes (P)  10   METs (P)  7.39     Rower   Level (P)  6   Watts (P)  110   Minutes (P)  10   METs (P)  7.5     Home Exercise Plan   Plans to continue exercise at (P)  Home (comment)   Frequency (P)  Add 3 additional days to program exercise sessions.      Nutrition:  Target Goals: Understanding of nutrition guidelines, daily intake of sodium 1500mg , cholesterol 200mg , calories 30% from fat and 7% or less from saturated fats, daily to have 5 or more servings of fruits and vegetables.  Biometrics:     Pre Biometrics - 03/25/17 0944      Pre Biometrics   Waist Circumference 38.5 inches   Hip Circumference 41.5 inches   Waist to Hip Ratio 0.93 %   Triceps Skinfold 18 mm   % Body Fat 28 %   Grip Strength 40 kg   Flexibility 8.5 in   Single Leg Stand 30 seconds       Nutrition Therapy Plan and Nutrition Goals:   Nutrition Discharge: Nutrition Scores:   Nutrition Goals Re-Evaluation:   Nutrition Goals Re-Evaluation:   Nutrition Goals Discharge (Final Nutrition Goals  Re-Evaluation):   Psychosocial: Target Goals: Acknowledge presence or absence of significant depression and/or stress, maximize coping skills, provide positive support system. Participant is able to verbalize types and ability to use techniques and skills needed for reducing stress and depression.  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 Yes     Barriers   Psychosocial barriers to participate in program The patient should benefit from training in stress management and relaxation.      Quality of Life Scores:     Quality of Life - 03/25/17 0916      Quality of Life Scores   Health/Function Pre 26.17 %   Socioeconomic Pre 26.64 %   Psych/Spiritual Pre 23.29 %   Family Pre 25.9 %   GLOBAL Pre 25.63 %      PHQ-9: Recent Review Flowsheet Data    Depression screen [facility] 2/9 04/02/2017   Decreased Interest 0   Down, Depressed, Hopeless 0   PHQ - 2 Score 0     Interpretation of Total Score  Total Score Depression Severity:  1-4 = Minimal depression, 5-9 = Mild depression, 10-14 = Moderate depression, 15-19 = Moderately severe depression, 20-27 = Severe depression   Psychosocial Evaluation and Intervention:     Psychosocial Evaluation - 04/02/17 0839      Psychosocial Evaluation & Interventions   Interventions Stress management education   Continue Psychosocial Services  Follow up required by staff      Psychosocial Re-Evaluation:     Psychosocial Re-Evaluation    Row Name 04/06/17 0730 05/04/17 1537           Psychosocial Re-Evaluation   Current issues with Current Stress  Concerns Current Stress Concerns      Comments pt with high stress career.  stress  management education is encouraged.  pt with high stress career.  stress management education is encouraged.       Expected Outcomes pt will demonstrate positive outlook with good coping skills.  pt will demonstrate positive outlook with good coping skills.       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  stress management education is encouraged.    Expected Outcomes pt will demonstrate positive outlook with good coping skills.    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   Vocational Rehab Evaluation & Intervention:     Vocational Rehab - 03/25/17 1206      Initial Vocational Rehab Evaluation & Intervention   Assessment shows need for Vocational Rehabilitation No      Education: Education Goals: Education classes will be provided on a weekly basis, covering required topics. Participant will state  understanding/return demonstration of topics presented.  Learning Barriers/Preferences:     Learning Barriers/Preferences - 03/25/17 0843      Learning Barriers/Preferences   Learning Barriers Sight   Learning Preferences Written Material;Verbal Instruction;Skilled Demonstration      Education Topics: Count Your Pulse:  -Group instruction provided by verbal instruction, demonstration, patient participation and written materials to support subject.  Instructors address importance of being able to find your pulse and how to count your pulse when at home without a heart monitor.  Patients get hands on experience counting their pulse with staff help and individually.   Heart Attack, Angina, and Risk Factor Modification:  -Group instruction provided by verbal instruction, video, and written materials to support subject.  Instructors address signs and symptoms of angina and heart attacks.    Also discuss risk factors for heart disease and how to make changes to improve heart health risk factors.   Functional Fitness:  -Group instruction provided by verbal instruction, demonstration, patient participation, and written materials to support subject.  Instructors address safety measures for doing things around the house.  Discuss how to get up and down off the floor, how to pick things up properly, how to safely get out of a chair without assistance, and balance training.   Meditation and Mindfulness:  -Group instruction provided by verbal instruction, patient participation, and written materials to support subject.  Instructor addresses importance of mindfulness and meditation practice to help reduce stress and improve awareness.  Instructor also leads participants  through a meditation exercise.    Stretching for Flexibility and Mobility:  -Group instruction provided by verbal instruction, patient participation, and written materials to support subject.  Instructors lead participants through  series of stretches that are designed to increase flexibility thus improving mobility.  These stretches are additional exercise for major muscle groups that are typically performed during regular warm up and cool down.   Hands Only CPR:  -Group verbal, video, and participation provides a basic overview of AHA guidelines for community CPR. Role-play of emergencies allow participants the opportunity to practice calling for help and chest compression technique with discussion of AED use.   Hypertension: -Group verbal and written instruction that provides a basic overview of hypertension including the most recent diagnostic guidelines, risk factor reduction with self-care instructions and medication management.    Nutrition I class: Heart Healthy Eating:  -Group instruction provided by PowerPoint slides, verbal discussion, and written materials to support subject matter. The instructor gives an explanation and review of the Therapeutic Lifestyle Changes diet recommendations, which includes a discussion on lipid goals, dietary fat, sodium, fiber, plant stanol/sterol esters, sugar, and the components of a well-balanced, healthy diet.   CARDIAC REHAB PHASE II EXERCISE from 04/19/2017 in [facility] CARDIAC REHAB  Date  04/20/17  Educator  RD  Instruction Review Code  2- meets goals/outcomes      Nutrition II class: Lifestyle Skills:  -Group instruction provided by PowerPoint slides, verbal discussion, and written materials to support subject matter. The instructor gives an explanation and review of label reading, grocery shopping for heart health, heart healthy recipe modifications, and ways to make healthier choices when eating out.   Diabetes Question & Answer:  -Group instruction provided by PowerPoint slides, verbal discussion, and written materials to support subject matter. The instructor gives an explanation and review of diabetes co-morbidities, pre- and post-prandial blood  glucose goals, pre-exercise blood glucose goals, signs, symptoms, and treatment of hypoglycemia and hyperglycemia, and foot care basics.   Diabetes Blitz:  -Group instruction provided by PowerPoint slides, verbal discussion, and written materials to support subject matter. The instructor gives an explanation and review of the physiology behind type 1 and type 2 diabetes, diabetes medications and rational behind using different medications, pre- and post-prandial blood glucose recommendations and Hemoglobin A1c goals, diabetes diet, and exercise including blood glucose guidelines for exercising safely.    Portion Distortion:  -Group instruction provided by PowerPoint slides, verbal discussion, written materials, and food models to support subject matter. The instructor gives an explanation of serving size versus portion size, changes in portions sizes over the last 20 years, and what consists of a serving from each food group.   Stress Management:  -Group instruction provided by verbal instruction, video, and written materials to support subject matter.  Instructors review role of stress in heart disease and how to cope with stress positively.     Exercising on Your Own:  -Group instruction provided by verbal instruction, power point, and written materials to support subject.  Instructors discuss benefits of exercise, components of exercise, frequency and intensity of exercise, and end points for exercise.  Also discuss use of nitroglycerin and activating EMS.  Review options of places to exercise outside of rehab.  Review guidelines for sex with heart disease.   Cardiac Drugs I:  -Group instruction provided by verbal instruction and written materials to support subject.  Instructor reviews cardiac drug classes: antiplatelets, anticoagulants, beta blockers, and statins.  Instructor discusses reasons, side effects,  and lifestyle considerations for each drug class.   Cardiac Drugs II:  -Group  instruction provided by verbal instruction and written materials to support subject.  Instructor reviews cardiac drug classes: angiotensin converting enzyme inhibitors (ACE-I), angiotensin II receptor blockers (ARBs), nitrates, and calcium channel blockers.  Instructor discusses reasons, side effects, and lifestyle considerations for each drug class.   Anatomy and Physiology of the Circulatory System:  Group verbal and written instruction and models provide basic cardiac anatomy and physiology, with the coronary electrical and arterial systems. Review of: AMI, Angina, Valve disease, Heart Failure, Peripheral Artery Disease, Cardiac Arrhythmia, Pacemakers, and the ICD.   Other Education:  -Group or individual verbal, written, or video instructions that support the educational goals of the cardiac rehab program.   Knowledge Questionnaire Score:     Knowledge Questionnaire Score - 03/25/17 0936      Knowledge Questionnaire Score   Pre Score 20/24      Core Components/Risk Factors/Patient Goals at Admission:     Personal Goals and Risk Factors at Admission - 04/06/17 0942      Core Components/Risk Factors/Patient Goals on Admission    Weight Management Weight Loss;Yes   Intervention Weight Management: Develop a combined nutrition and exercise program designed to reach desired caloric intake, while maintaining appropriate intake of nutrient and fiber, sodium and fats, and appropriate energy expenditure required for the weight goal.;Weight Management: Provide education and appropriate resources to help participant work on and attain dietary goals.;Weight Management/Obesity: Establish reasonable short term and long term weight goals.;Obesity: Provide education and appropriate resources to help participant work on and attain dietary goals.   Admit Weight 207 lb 3.7 oz (94 kg)   Goal Weight: Short Term 204 lb (92.5 kg)   Goal Weight: Long Term 197 lb (89.4 kg)   Expected Outcomes Weight  Maintenance: Understanding of the daily nutrition guidelines, which includes 25-35% calories from fat, 7% or less cal from saturated fats, less than 200mg  cholesterol, less than 1.5gm of sodium, & 5 or more servings of fruits and vegetables daily;Short Term: Continue to assess and modify interventions until short term weight is achieved;Long Term: Adherence to nutrition and physical activity/exercise program aimed toward attainment of established weight goal;Weight Loss: Understanding of general recommendations for a balanced deficit meal plan, which promotes 1-2 lb weight loss per week and includes a negative energy balance of [phone] kcal/d;Understanding recommendations for meals to include 15-35% energy as protein, 25-35% energy from fat, 35-60% energy from carbohydrates, less than 200mg  of dietary cholesterol, 20-35 gm of total fiber daily;Understanding of distribution of calorie intake throughout the day with the consumption of 4-5 meals/snacks   Stress Yes   Personal Goal Other Yes   Personal Goal Learn more about heart healthy eating/nutrition   Intervention Attend Tuesday nutrition classess   Expected Outcomes Pt will develop an improved understanding of heart healthy nutrition as evident by his food selection, choices and reaching desired weight.      Core Components/Risk Factors/Patient Goals Review:      Goals and Risk Factor Review    Row Name 04/06/17 0728 05/04/17 1537           Core Components/Risk Factors/Patient Goals Review   Personal Goals Review Weight Management/Obesity;Other;Stress Weight Management/Obesity;Other;Stress      Review pt demonstrates eagerness to participate in CR activities. pt expressed personal goal of learning heart healthy nutrition. pt has been encouraged to participate in Tuesday nutrition classes.  pt demonstrates eagerness to participate in CR activities. pt  expressed personal goal of learning heart healthy nutrition. pt has been encouraged to  participate in Tuesday nutrition classes.       Expected Outcomes pt will participate in CR exercise, nutrition, and lifestyle education opportunities to decrease overall CAD risk factors.  pt will participate in CR exercise, nutrition, and lifestyle education opportunities to decrease overall CAD risk factors.          Core Components/Risk Factors/Patient Goals at Discharge (Final Review):      Goals and Risk Factor Review - 05/04/17 1537      Core Components/Risk Factors/Patient Goals Review   Personal Goals Review Weight Management/Obesity;Other;Stress   Review pt demonstrates eagerness to participate in CR activities. pt expressed personal goal of learning heart healthy nutrition. pt has been encouraged to participate in Tuesday nutrition classes.    Expected Outcomes pt will participate in CR exercise, nutrition, and lifestyle education opportunities to decrease overall CAD risk factors.       ITP Comments:     ITP Comments    Row Name 03/25/17 [phone] 04/06/17 0727 05/04/17 1536       ITP Comments Medical Director, Dr. Armanda Magic Medical Director, Dr. Armanda Magic Medical Director, Dr. Armanda Magic        Comments: Pt is making expected progress toward personal goals after completing 10 sessions. Recommend continued exercise and life style modification education including  stress management and relaxation techniques to decrease cardiac risk profile.

## 2017-05-05 ENCOUNTER — Encounter (HOSPITAL_COMMUNITY)
Admission: RE | Admit: 2017-05-05 | Discharge: 2017-05-05 | Disposition: A | Discharge status: Home / Self Care | Payer: PRIVATE HEALTH INSURANCE | Source: Ambulatory Visit | Attending: Cardiology | Admitting: Cardiology

## 2017-05-05 DIAGNOSIS — Z955 Presence of coronary angioplasty implant and graft: Secondary | ICD-10-CM

## 2017-05-05 DIAGNOSIS — Z48812 Encounter for surgical aftercare following surgery on the circulatory system: Secondary | ICD-10-CM | POA: Diagnosis not present

## 2017-05-07 ENCOUNTER — Encounter (HOSPITAL_COMMUNITY)
Admission: RE | Admit: 2017-05-07 | Discharge: 2017-05-07 | Disposition: A | Discharge status: Home / Self Care | Payer: PRIVATE HEALTH INSURANCE | Source: Ambulatory Visit | Attending: Cardiology | Admitting: Cardiology

## 2017-05-07 DIAGNOSIS — Z955 Presence of coronary angioplasty implant and graft: Secondary | ICD-10-CM

## 2017-05-07 DIAGNOSIS — Z48812 Encounter for surgical aftercare following surgery on the circulatory system: Secondary | ICD-10-CM | POA: Diagnosis not present

## 2017-05-10 ENCOUNTER — Encounter (HOSPITAL_COMMUNITY)
Admission: RE | Admit: 2017-05-10 | Discharge: 2017-05-10 | Disposition: A | Discharge status: Home / Self Care | Payer: PRIVATE HEALTH INSURANCE | Source: Ambulatory Visit | Attending: Cardiology | Admitting: Cardiology

## 2017-05-10 DIAGNOSIS — Z955 Presence of coronary angioplasty implant and graft: Secondary | ICD-10-CM

## 2017-05-10 DIAGNOSIS — Z48812 Encounter for surgical aftercare following surgery on the circulatory system: Secondary | ICD-10-CM | POA: Diagnosis not present

## 2017-05-12 ENCOUNTER — Encounter (HOSPITAL_COMMUNITY)
Admission: RE | Admit: 2017-05-12 | Discharge: 2017-05-12 | Disposition: A | Discharge status: Home / Self Care | Payer: PRIVATE HEALTH INSURANCE | Source: Ambulatory Visit | Attending: Cardiology | Admitting: Cardiology

## 2017-05-12 DIAGNOSIS — Z48812 Encounter for surgical aftercare following surgery on the circulatory system: Secondary | ICD-10-CM | POA: Diagnosis not present

## 2017-05-12 DIAGNOSIS — Z955 Presence of coronary angioplasty implant and graft: Secondary | ICD-10-CM

## 2017-05-14 ENCOUNTER — Encounter (HOSPITAL_COMMUNITY)
Admission: RE | Admit: 2017-05-14 | Discharge: 2017-05-14 | Disposition: A | Discharge status: Home / Self Care | Payer: PRIVATE HEALTH INSURANCE | Source: Ambulatory Visit | Attending: Cardiology | Admitting: Cardiology

## 2017-05-14 DIAGNOSIS — Z955 Presence of coronary angioplasty implant and graft: Secondary | ICD-10-CM

## 2017-05-14 DIAGNOSIS — Z48812 Encounter for surgical aftercare following surgery on the circulatory system: Secondary | ICD-10-CM | POA: Diagnosis not present

## 2017-05-17 ENCOUNTER — Encounter (HOSPITAL_COMMUNITY)
Admission: RE | Admit: 2017-05-17 | Discharge: 2017-05-17 | Disposition: A | Discharge status: Home / Self Care | Payer: PRIVATE HEALTH INSURANCE | Source: Ambulatory Visit | Attending: Cardiology | Admitting: Cardiology

## 2017-05-17 DIAGNOSIS — Z48812 Encounter for surgical aftercare following surgery on the circulatory system: Secondary | ICD-10-CM | POA: Diagnosis not present

## 2017-05-17 DIAGNOSIS — Z955 Presence of coronary angioplasty implant and graft: Secondary | ICD-10-CM

## 2017-05-19 ENCOUNTER — Encounter (HOSPITAL_COMMUNITY)
Admission: RE | Admit: 2017-05-19 | Discharge: 2017-05-19 | Disposition: A | Discharge status: Home / Self Care | Payer: PRIVATE HEALTH INSURANCE | Source: Ambulatory Visit | Attending: Cardiology | Admitting: Cardiology

## 2017-05-19 DIAGNOSIS — Z955 Presence of coronary angioplasty implant and graft: Secondary | ICD-10-CM | POA: Insufficient documentation

## 2017-05-19 DIAGNOSIS — Z48812 Encounter for surgical aftercare following surgery on the circulatory system: Secondary | ICD-10-CM | POA: Diagnosis not present

## 2017-05-19 NOTE — Progress Notes (Signed)
Colton Davidson 61 y.o. male Nutrition Note Spoke with pt. Nutrition Survey reviewed with pt. Pt is following Step 1 of the Therapeutic Lifestyle Changes diet. Pt wants to lose 10 lb more but states he has only lost 2 lb. Pt has returned to his lunch time exercise routine and soccer, which pt is pleased about. Pt expressed understanding of the information reviewed. Pt aware of nutrition education classes offered. No results found for: HGBA1C Wt Readings from Last 3 Encounters:  03/23/17 210 lb (95.3 kg)  03/04/17 202 lb (91.6 kg)  02/23/17 211 lb 9.6 oz (96 kg)   Nutrition Diagnosis ? Food-and nutrition-related knowledge deficit related to lack of exposure to information as related to diagnosis of: ? CVD ? Obesity related to excessive energy intake as evidenced by a BMI of 30.2 Nutrition Intervention ? Benefits of adopting Therapeutic Lifestyle Changes discussed when [employer] reviewed. ? Pt to attend the Portion Distortion class ? Pt to attend the  ? Nutrition I class - met 04/20/17                       ? Nutrition II class ? Pt given handouts for:? Nutrition II class ? Continue client-centered nutrition education by RD, as part of interdisciplinary care.  Goal(s) ? Pt to identify food quantities necessary to achieve weight loss of 6-10 lb at graduation from cardiac rehab.   Monitor and Evaluate progress toward nutrition goal with team.  Derek Mound, M.Ed, RD, LDN, CDE 05/19/2017 9:04 AM

## 2017-05-21 ENCOUNTER — Encounter (HOSPITAL_COMMUNITY)
Admission: RE | Admit: 2017-05-21 | Discharge: 2017-05-21 | Disposition: A | Discharge status: Home / Self Care | Payer: PRIVATE HEALTH INSURANCE | Source: Ambulatory Visit | Attending: Cardiology | Admitting: Cardiology

## 2017-05-21 DIAGNOSIS — Z48812 Encounter for surgical aftercare following surgery on the circulatory system: Secondary | ICD-10-CM | POA: Diagnosis not present

## 2017-05-21 DIAGNOSIS — Z955 Presence of coronary angioplasty implant and graft: Secondary | ICD-10-CM

## 2017-05-24 ENCOUNTER — Encounter (HOSPITAL_COMMUNITY): Payer: PRIVATE HEALTH INSURANCE

## 2017-05-26 ENCOUNTER — Encounter (HOSPITAL_COMMUNITY): Payer: PRIVATE HEALTH INSURANCE

## 2017-05-28 ENCOUNTER — Encounter (HOSPITAL_COMMUNITY)
Admission: RE | Admit: 2017-05-28 | Discharge: 2017-05-28 | Disposition: A | Discharge status: Home / Self Care | Payer: PRIVATE HEALTH INSURANCE | Source: Ambulatory Visit | Attending: Cardiology | Admitting: Cardiology

## 2017-05-31 ENCOUNTER — Encounter (HOSPITAL_COMMUNITY): Admission: RE | Admit: 2017-05-31 | Payer: PRIVATE HEALTH INSURANCE | Source: Ambulatory Visit

## 2017-06-02 ENCOUNTER — Encounter (HOSPITAL_COMMUNITY)
Admission: RE | Admit: 2017-06-02 | Discharge: 2017-06-02 | Disposition: A | Discharge status: Home / Self Care | Payer: PRIVATE HEALTH INSURANCE | Source: Ambulatory Visit | Attending: Cardiology | Admitting: Cardiology

## 2017-06-02 DIAGNOSIS — Z955 Presence of coronary angioplasty implant and graft: Secondary | ICD-10-CM

## 2017-06-02 DIAGNOSIS — Z48812 Encounter for surgical aftercare following surgery on the circulatory system: Secondary | ICD-10-CM | POA: Diagnosis not present

## 2017-06-02 NOTE — Progress Notes (Signed)
Cardiac Individual Treatment Plan  Patient Details  Name: ETHON WYMER MRN: [id number] Date of Birth: 1955/12/11 Referring Provider:     CARDIAC REHAB PHASE II ORIENTATION from 03/25/2017 in [facility] CARDIAC REHAB  Referring Provider  Donato Schultz MD      Initial Encounter Date:    CARDIAC REHAB PHASE II ORIENTATION from 03/25/2017 in [facility] CARDIAC REHAB  Date  03/25/17  Referring Provider  Donato Schultz MD      Visit Diagnosis: 03/04/17 DES LAD  Patient's Home Medications on Admission:  Current Outpatient Prescriptions:  .  aspirin EC 81 MG tablet, Take 1 tablet (81 mg total) by mouth daily., Disp: 30 tablet, Rfl: 11 .  atorvastatin (LIPITOR) 80 MG tablet, Take 1 tablet (80 mg total) by mouth daily at 6 PM., Disp: 30 tablet, Rfl: 6 .  clopidogrel (PLAVIX) 75 MG tablet, Take 1 tablet (75 mg total) by mouth daily., Disp: 30 tablet, Rfl: 0 .  Clotrimazole (LOTRIMIN AF EX), Apply 1 application topically daily as needed (athlete's foot)., Disp: , Rfl:  .  fluticasone (FLONASE) 50 MCG/ACT nasal spray, Place 1 spray into both nostrils at bedtime as needed for allergies or rhinitis., Disp: , Rfl:  .  nitroGLYCERIN (NITROSTAT) 0.4 MG SL tablet, Place 1 tablet (0.4 mg total) under the tongue every 5 (five) minutes as needed for chest pain., Disp: 25 tablet, Rfl: prn  Past Medical History: Past Medical History:  Diagnosis Date  . Osteoarthritis    back  . Other specified cardiac dysrhythmias(427.89)    bradycardia  . Palpitations   . Paroxysmal supraventricular tachycardia (HCC)     Tobacco Use: History  Smoking Status  . Never Smoker  Smokeless Tobacco  . Never Used    Labs: Recent Review Flowsheet Data    Labs for ITP Cardiac and Pulmonary Rehab Latest Ref Rng & Units 02/24/2016   Cholestrol 125 - 200 mg/dL 742   LDLCALC <595 mg/dL 638   HDL >=75 mg/dL 54   Trlycerides <643 mg/dL 88      Capillary Blood Glucose: No results  found for: GLUCAP   Exercise Target Goals:    Exercise Program Goal: Individual exercise prescription set with THRR, safety & activity barriers. Participant demonstrates ability to understand and report RPE using BORG scale, to self-measure pulse accurately, and to acknowledge the importance of the exercise prescription.  Exercise Prescription Goal: Starting with aerobic activity 30 plus minutes a day, 3 days per week for initial exercise prescription. Provide home exercise prescription and guidelines that participant acknowledges understanding prior to discharge.  Activity Barriers & Risk Stratification:     Activity Barriers & Cardiac Risk Stratification - 03/25/17 0843      Activity Barriers & Cardiac Risk Stratification   Activity Barriers Back Problems;Joint Problems   Cardiac Risk Stratification High      6 Minute Walk:     6 Minute Walk    Row Name 03/25/17 0936         6 Minute Walk   Phase Initial     Distance 1900 feet     Walk Time 6 minutes     # of Rest Breaks 0     MPH 3.6     METS 4.4     RPE 10     VO2 Peak 15.4     Symptoms No     Resting HR 51 bpm     Resting BP 136/78     Max  Ex. HR 80 bpm     Max Ex. BP 150/86     2 Minute Post BP 122/76        Oxygen Initial Assessment:   Oxygen Re-Evaluation:   Oxygen Discharge (Final Oxygen Re-Evaluation):   Initial Exercise Prescription:     Initial Exercise Prescription - 03/25/17 0900      Date of Initial Exercise RX and Referring Provider   Date 03/25/17   Referring Provider Donato Schultz MD     Treadmill   MPH 3.5   Grade 3   Minutes 10   METs 5.13     Bike   Level 1.2   Minutes 10   METs 3.4     Rower   Level 6   Watts 65   Minutes 10   METs 5.5     Prescription Details   Frequency (times per week) 3   Duration Progress to 45 minutes of aerobic exercise without signs/symptoms of physical distress     Intensity   THRR 40-80% of Max Heartrate 65-130   Ratings of  Perceived Exertion 11-15   Perceived Dyspnea 0-4     Progression   Progression Continue to progress workloads to maintain intensity without signs/symptoms of physical distress.     Resistance Training   Training Prescription Yes   Weight 5   Reps 10-15      Perform Capillary Blood Glucose checks as needed.  Exercise Prescription Changes:     Exercise Prescription Changes    Row Name 04/28/17 0800 05/27/17 1500           Response to Exercise   Blood Pressure (Admit) (P)  118/80 148/78      Blood Pressure (Exercise) (P)  150/88 180/84      Blood Pressure (Exit) (P)  112/68 128/70      Heart Rate (Admit) (P)  67 bpm 55 bpm      Heart Rate (Exercise) (P)  111 bpm 126 bpm      Heart Rate (Exit) (P)  66 bpm 60 bpm      Rating of Perceived Exertion (Exercise) (P)  13 13      Duration (P)  Progress to 45 minutes of aerobic exercise without signs/symptoms of physical distress Progress to 45 minutes of aerobic exercise without signs/symptoms of physical distress      Intensity (P)  THRR unchanged THRR unchanged        Progression   Progression (P)  Continue to progress workloads to maintain intensity without signs/symptoms of physical distress. Continue to progress workloads to maintain intensity without signs/symptoms of physical distress.      Average METs (P)  6.6 7        Resistance Training   Training Prescription (P)  Yes Yes      Weight (P)  8lbs 8lbs      Reps (P)  10-15 10-15        Treadmill   MPH (P)  3.5 3.5      Grade (P)  3 3      Minutes (P)  10 10      METs (P)  5.13 5.13        Bike   Level (P)  3.2 3.2      Minutes (P)  10 10      METs (P)  7.39 7.39        Rower   Level (P)  6 6      Watts (P)  110  142      Minutes (P)  10 10      METs (P)  7.5 8.3        Home Exercise Plan   Plans to continue exercise at (P)  Home (comment) Home (comment)      Frequency (P)  Add 3 additional days to program exercise sessions. Add 3 additional days to program  exercise sessions.         Exercise Comments:     Exercise Comments    Row Name 04/05/17 0910 04/28/17 1353 05/27/17 1521       Exercise Comments Pt is off to a great start with exercise.  Pt continues to do well with exercise  reviewed goals with pt.        Exercise Goals and Review:     Exercise Goals    Row Name 03/25/17 0843             Exercise Goals   Increase Physical Activity Yes       Intervention Provide advice, education, support and counseling about physical activity/exercise needs.;Develop an individualized exercise prescription for aerobic and resistive training based on initial evaluation findings, risk stratification, comorbidities and participant's personal goals.       Expected Outcomes Achievement of increased cardiorespiratory fitness and enhanced flexibility, muscular endurance and strength shown through measurements of functional capacity and personal statement of participant.       Increase Strength and Stamina Yes       Intervention Develop an individualized exercise prescription for aerobic and resistive training based on initial evaluation findings, risk stratification, comorbidities and participant's personal goals.;Provide advice, education, support and counseling about physical activity/exercise needs.       Expected Outcomes Achievement of increased cardiorespiratory fitness and enhanced flexibility, muscular endurance and strength shown through measurements of functional capacity and personal statement of participant.          Exercise Goals Re-Evaluation :     Exercise Goals Re-Evaluation    Row Name 04/05/17 0908 04/28/17 1353 05/27/17 1520         Exercise Goal Re-Evaluation   Exercise Goals Review Increase Physical Activity;Increase Strenth and Stamina Increase Physical Activity;Increase Strenth and Stamina Increase Physical Activity;Increase Strenth and Stamina     Comments Pt is off to a great start with exercise and he is currently  exericse at the downtown [facility] 3-4 days/week for about 1 hour. Pt plays soccer 2xs/week and exercises at the [facility] 3-4 days/week pt states he feels good and feels like he's getting stronger.  He is still playing soccer 2xs/week and exercises at the [facility] 3-4 days/week     Expected Outcomes continue with exercise Rx and home exercise program and increase workloads as tolerated in order to increase cardiorespiratory fitness continue with exercise Rx and home exercise program and increase workloads as tolerated in order to increase cardiorespiratory fitness continue with exercise Rx and home exercise program and increase workloads as tolerated in order to increase cardiorespiratory fitness         Discharge Exercise Prescription (Final Exercise Prescription Changes):     Exercise Prescription Changes - 05/27/17 1500      Response to Exercise   Blood Pressure (Admit) 148/78   Blood Pressure (Exercise) 180/84   Blood Pressure (Exit) 128/70   Heart Rate (Admit) 55 bpm   Heart Rate (Exercise) 126 bpm   Heart Rate (Exit) 60 bpm   Rating of Perceived Exertion (Exercise) 13   Duration Progress to 45 minutes  of aerobic exercise without signs/symptoms of physical distress   Intensity THRR unchanged     Progression   Progression Continue to progress workloads to maintain intensity without signs/symptoms of physical distress.   Average METs 7     Resistance Training   Training Prescription Yes   Weight 8lbs   Reps 10-15     Treadmill   MPH 3.5   Grade 3   Minutes 10   METs 5.13     Bike   Level 3.2   Minutes 10   METs 7.39     Rower   Level 6   Watts 142   Minutes 10   METs 8.3     Home Exercise Plan   Plans to continue exercise at Home (comment)   Frequency Add 3 additional days to program exercise sessions.      Nutrition:  Target Goals: Understanding of nutrition guidelines, daily intake of sodium 1500mg , cholesterol 200mg , calories 30% from fat and 7% or less from  saturated fats, daily to have 5 or more servings of fruits and vegetables.  Biometrics:     Pre Biometrics - 03/25/17 0944      Pre Biometrics   Waist Circumference 38.5 inches   Hip Circumference 41.5 inches   Waist to Hip Ratio 0.93 %   Triceps Skinfold 18 mm   % Body Fat 28 %   Grip Strength 40 kg   Flexibility 8.5 in   Single Leg Stand 30 seconds       Nutrition Therapy Plan and Nutrition Goals:     Nutrition Therapy & Goals - 05/19/17 0846      Nutrition Therapy   Diet Therapeutic Lifestyle Changes     Personal Nutrition Goals   Nutrition Goal Wt loss of 1-2 lb per week to a wt loss goal of 6-10 lb at graduation from Cardiac Rehab.      Intervention Plan   Intervention Prescribe, educate and counsel regarding individualized specific dietary modifications aiming towards targeted core components such as weight, hypertension, lipid management, diabetes, heart failure and other comorbidities.   Expected Outcomes Short Term Goal: Understand basic principles of dietary content, such as calories, fat, sodium, cholesterol and nutrients.;Long Term Goal: Adherence to prescribed nutrition plan.      Nutrition Discharge: Nutrition Scores:     Nutrition Assessments - 05/19/17 0846      MEDFICTS Scores   Pre Score 46      Nutrition Goals Re-Evaluation:   Nutrition Goals Re-Evaluation:   Nutrition Goals Discharge (Final Nutrition Goals Re-Evaluation):   Psychosocial: Target Goals: Acknowledge presence or absence of significant depression and/or stress, maximize coping skills, provide positive support system. Participant is able to verbalize types and ability to use techniques and skills needed for reducing stress and depression.  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 Yes     Barriers   Psychosocial barriers to participate in program The patient should benefit from training in stress management and relaxation.      Quality of Life Scores:     Quality of Life - 03/25/17 0916      Quality of Life Scores  Health/Function Pre 26.17 %   Socioeconomic Pre 26.64 %   Psych/Spiritual Pre 23.29 %   Family Pre 25.9 %   GLOBAL Pre 25.63 %      PHQ-9: Recent Review Flowsheet Data    Depression screen [facility] 2/9 04/02/2017   Decreased Interest 0   Down, Depressed, Hopeless 0   PHQ - 2 Score 0     Interpretation of Total Score  Total Score Depression Severity:  1-4 = Minimal depression, 5-9 = Mild depression, 10-14 = Moderate depression, 15-19 = Moderately severe depression, 20-27 = Severe depression   Psychosocial Evaluation and Intervention:     Psychosocial Evaluation - 04/02/17 0839      Psychosocial Evaluation & Interventions   Interventions Stress management education   Continue Psychosocial Services  Follow up required by staff      Psychosocial Re-Evaluation:     Psychosocial Re-Evaluation    Row Name 04/06/17 0730 05/04/17 1537 06/01/17 1043         Psychosocial Re-Evaluation   Current issues with Current Stress Concerns Current Stress Concerns Current Stress Concerns     Comments pt with high stress career.  stress management education is encouraged.  pt with high stress career.  stress management education is encouraged.  pt with high stress career.  stress management education is encouraged.      Expected Outcomes pt will demonstrate positive outlook with good coping skills.  pt will demonstrate positive outlook with good coping skills.  pt will demonstrate positive outlook with good coping skills.      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  stress management education is encouraged.    Expected Outcomes pt will demonstrate positive outlook with good coping skills.    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   Vocational Rehab Evaluation & Intervention:     Vocational Rehab - 03/25/17 1206      Initial Vocational Rehab Evaluation & Intervention   Assessment shows need for Vocational Rehabilitation No      Education: Education Goals: Education classes will be provided on a weekly basis, covering required topics. Participant will state understanding/return demonstration of topics presented.  Learning Barriers/Preferences:     Learning Barriers/Preferences - 03/25/17 0843      Learning  Barriers/Preferences   Learning Barriers Sight  Learning Preferences Written Material;Verbal Instruction;Skilled Demonstration      Education Topics: Count Your Pulse:  -Group instruction provided by verbal instruction, demonstration, patient participation and written materials to support subject.  Instructors address importance of being able to find your pulse and how to count your pulse when at home without a heart monitor.  Patients get hands on experience counting their pulse with staff help and individually.   Heart Attack, Angina, and Risk Factor Modification:  -Group instruction provided by verbal instruction, video, and written materials to support subject.  Instructors address signs and symptoms of angina and heart attacks.    Also discuss risk factors for heart disease and how to make changes to improve heart health risk factors.   Functional Fitness:  -Group instruction provided by verbal instruction, demonstration, patient participation, and written materials to support subject.  Instructors address safety measures for doing things around the house.  Discuss how to get up and down off the floor, how to pick things up properly, how to safely get out of a chair without assistance, and balance training.   Meditation and Mindfulness:  -Group instruction provided by verbal instruction, patient participation, and written materials to support subject.  Instructor addresses importance of mindfulness and meditation practice to help reduce stress and improve awareness.  Instructor also leads participants through a meditation exercise.    Stretching for Flexibility and Mobility:  -Group instruction provided by verbal instruction, patient participation, and written materials to support subject.  Instructors lead participants through series of stretches that are designed to increase flexibility thus improving mobility.  These stretches are additional exercise for major muscle groups that are  typically performed during regular warm up and cool down.   CARDIAC REHAB PHASE II EXERCISE from 05/19/2017 in [facility] CARDIAC REHAB  Date  05/14/17  Instruction Review Code  2- meets goals/outcomes      Hands Only CPR:  -Group verbal, video, and participation provides a basic overview of AHA guidelines for community CPR. Role-play of emergencies allow participants the opportunity to practice calling for help and chest compression technique with discussion of AED use.   Hypertension: -Group verbal and written instruction that provides a basic overview of hypertension including the most recent diagnostic guidelines, risk factor reduction with self-care instructions and medication management.    Nutrition I class: Heart Healthy Eating:  -Group instruction provided by PowerPoint slides, verbal discussion, and written materials to support subject matter. The instructor gives an explanation and review of the Therapeutic Lifestyle Changes diet recommendations, which includes a discussion on lipid goals, dietary fat, sodium, fiber, plant stanol/sterol esters, sugar, and the components of a well-balanced, healthy diet.   CARDIAC REHAB PHASE II EXERCISE from 05/19/2017 in [facility] CARDIAC REHAB  Date  04/20/17  Educator  RD  Instruction Review Code  2- meets goals/outcomes      Nutrition II class: Lifestyle Skills:  -Group instruction provided by PowerPoint slides, verbal discussion, and written materials to support subject matter. The instructor gives an explanation and review of label reading, grocery shopping for heart health, heart healthy recipe modifications, and ways to make healthier choices when eating out.   CARDIAC REHAB PHASE II EXERCISE from 05/19/2017 in [facility] CARDIAC REHAB  Date  05/19/17  Educator  RD  Instruction Review Code  Not applicable      Diabetes Question & Answer:  -Group instruction provided by PowerPoint  slides, verbal discussion, and written materials to support subject  matter. The instructor gives an explanation and review of diabetes co-morbidities, pre- and post-prandial blood glucose goals, pre-exercise blood glucose goals, signs, symptoms, and treatment of hypoglycemia and hyperglycemia, and foot care basics.   Diabetes Blitz:  -Group instruction provided by PowerPoint slides, verbal discussion, and written materials to support subject matter. The instructor gives an explanation and review of the physiology behind type 1 and type 2 diabetes, diabetes medications and rational behind using different medications, pre- and post-prandial blood glucose recommendations and Hemoglobin A1c goals, diabetes diet, and exercise including blood glucose guidelines for exercising safely.    Portion Distortion:  -Group instruction provided by PowerPoint slides, verbal discussion, written materials, and food models to support subject matter. The instructor gives an explanation of serving size versus portion size, changes in portions sizes over the last 20 years, and what consists of a serving from each food group.   Stress Management:  -Group instruction provided by verbal instruction, video, and written materials to support subject matter.  Instructors review role of stress in heart disease and how to cope with stress positively.     Exercising on Your Own:  -Group instruction provided by verbal instruction, power point, and written materials to support subject.  Instructors discuss benefits of exercise, components of exercise, frequency and intensity of exercise, and end points for exercise.  Also discuss use of nitroglycerin and activating EMS.  Review options of places to exercise outside of rehab.  Review guidelines for sex with heart disease.   Cardiac Drugs I:  -Group instruction provided by verbal instruction and written materials to support subject.  Instructor reviews cardiac drug classes:  antiplatelets, anticoagulants, beta blockers, and statins.  Instructor discusses reasons, side effects, and lifestyle considerations for each drug class.   Cardiac Drugs II:  -Group instruction provided by verbal instruction and written materials to support subject.  Instructor reviews cardiac drug classes: angiotensin converting enzyme inhibitors (ACE-I), angiotensin II receptor blockers (ARBs), nitrates, and calcium channel blockers.  Instructor discusses reasons, side effects, and lifestyle considerations for each drug class.   Anatomy and Physiology of the Circulatory System:  Group verbal and written instruction and models provide basic cardiac anatomy and physiology, with the coronary electrical and arterial systems. Review of: AMI, Angina, Valve disease, Heart Failure, Peripheral Artery Disease, Cardiac Arrhythmia, Pacemakers, and the ICD.   Other Education:  -Group or individual verbal, written, or video instructions that support the educational goals of the cardiac rehab program.   Knowledge Questionnaire Score:     Knowledge Questionnaire Score - 03/25/17 0936      Knowledge Questionnaire Score   Pre Score 20/24      Core Components/Risk Factors/Patient Goals at Admission:     Personal Goals and Risk Factors at Admission - 04/06/17 0942      Core Components/Risk Factors/Patient Goals on Admission    Weight Management Weight Loss;Yes   Intervention Weight Management: Develop a combined nutrition and exercise program designed to reach desired caloric intake, while maintaining appropriate intake of nutrient and fiber, sodium and fats, and appropriate energy expenditure required for the weight goal.;Weight Management: Provide education and appropriate resources to help participant work on and attain dietary goals.;Weight Management/Obesity: Establish reasonable short term and long term weight goals.;Obesity: Provide education and appropriate resources to help participant work on  and attain dietary goals.   Admit Weight 207 lb 3.7 oz (94 kg)   Goal Weight: Short Term 204 lb (92.5 kg)   Goal Weight: Long Term 197 lb (89.4  kg)   Expected Outcomes Weight Maintenance: Understanding of the daily nutrition guidelines, which includes 25-35% calories from fat, 7% or less cal from saturated fats, less than 200mg  cholesterol, less than 1.5gm of sodium, & 5 or more servings of fruits and vegetables daily;Short Term: Continue to assess and modify interventions until short term weight is achieved;Long Term: Adherence to nutrition and physical activity/exercise program aimed toward attainment of established weight goal;Weight Loss: Understanding of general recommendations for a balanced deficit meal plan, which promotes 1-2 lb weight loss per week and includes a negative energy balance of [phone] kcal/d;Understanding recommendations for meals to include 15-35% energy as protein, 25-35% energy from fat, 35-60% energy from carbohydrates, less than 200mg  of dietary cholesterol, 20-35 gm of total fiber daily;Understanding of distribution of calorie intake throughout the day with the consumption of 4-5 meals/snacks   Stress Yes   Personal Goal Other Yes   Personal Goal Learn more about heart healthy eating/nutrition   Intervention Attend Tuesday nutrition classess   Expected Outcomes Pt will develop an improved understanding of heart healthy nutrition as evident by his food selection, choices and reaching desired weight.      Core Components/Risk Factors/Patient Goals Review:      Goals and Risk Factor Review    Row Name 04/06/17 0728 05/04/17 1537 06/01/17 1042         Core Components/Risk Factors/Patient Goals Review   Personal Goals Review Weight Management/Obesity;Other;Stress Weight Management/Obesity;Other;Stress Weight Management/Obesity;Other;Stress     Review pt demonstrates eagerness to participate in CR activities. pt expressed personal goal of learning heart healthy  nutrition. pt has been encouraged to participate in Tuesday nutrition classes.  pt demonstrates eagerness to participate in CR activities. pt expressed personal goal of learning heart healthy nutrition. pt has been encouraged to participate in Tuesday nutrition classes.  pt demonstrates eagerness to participate in CR activities. pt expressed personal goal of learning heart healthy nutrition. pt has been encouraged to participate in Tuesday nutrition classes.      Expected Outcomes pt will participate in CR exercise, nutrition, and lifestyle education opportunities to decrease overall CAD risk factors.  pt will participate in CR exercise, nutrition, and lifestyle education opportunities to decrease overall CAD risk factors.  pt will participate in CR exercise, nutrition, and lifestyle education opportunities to decrease overall CAD risk factors.         Core Components/Risk Factors/Patient Goals at Discharge (Final Review):      Goals and Risk Factor Review - 06/01/17 1042      Core Components/Risk Factors/Patient Goals Review   Personal Goals Review Weight Management/Obesity;Other;Stress   Review pt demonstrates eagerness to participate in CR activities. pt expressed personal goal of learning heart healthy nutrition. pt has been encouraged to participate in Tuesday nutrition classes.    Expected Outcomes pt will participate in CR exercise, nutrition, and lifestyle education opportunities to decrease overall CAD risk factors.       ITP Comments:     ITP Comments    Row Name 03/25/17 [phone] 04/06/17 0727 05/04/17 1536 06/02/17 1651     ITP Comments Medical Director, Dr. Armanda Magic Medical Director, Dr. Armanda Magic Medical Director, Dr. Armanda Magic Medical Director, Dr. Armanda Magic       Comments: Pt is making expected progress toward personal goals after completing 20 sessions. Recommend continued exercise and life style modification education including  stress management and relaxation  techniques to decrease cardiac risk profile.

## 2017-06-04 ENCOUNTER — Encounter (HOSPITAL_COMMUNITY)
Admission: RE | Admit: 2017-06-04 | Discharge: 2017-06-04 | Disposition: A | Discharge status: Home / Self Care | Payer: PRIVATE HEALTH INSURANCE | Source: Ambulatory Visit | Attending: Cardiology | Admitting: Cardiology

## 2017-06-04 DIAGNOSIS — Z48812 Encounter for surgical aftercare following surgery on the circulatory system: Secondary | ICD-10-CM | POA: Diagnosis not present

## 2017-06-04 DIAGNOSIS — Z955 Presence of coronary angioplasty implant and graft: Secondary | ICD-10-CM

## 2017-06-07 ENCOUNTER — Encounter (HOSPITAL_COMMUNITY): Payer: PRIVATE HEALTH INSURANCE

## 2017-06-09 ENCOUNTER — Encounter (HOSPITAL_COMMUNITY): Payer: PRIVATE HEALTH INSURANCE

## 2017-06-11 ENCOUNTER — Encounter (HOSPITAL_COMMUNITY)
Admission: RE | Admit: 2017-06-11 | Discharge: 2017-06-11 | Disposition: A | Discharge status: Home / Self Care | Payer: PRIVATE HEALTH INSURANCE | Source: Ambulatory Visit | Attending: Cardiology | Admitting: Cardiology

## 2017-06-11 DIAGNOSIS — Z955 Presence of coronary angioplasty implant and graft: Secondary | ICD-10-CM

## 2017-06-11 DIAGNOSIS — Z48812 Encounter for surgical aftercare following surgery on the circulatory system: Secondary | ICD-10-CM | POA: Diagnosis not present

## 2017-06-14 ENCOUNTER — Encounter (HOSPITAL_COMMUNITY)
Admission: RE | Admit: 2017-06-14 | Discharge: 2017-06-14 | Disposition: A | Discharge status: Home / Self Care | Payer: PRIVATE HEALTH INSURANCE | Source: Ambulatory Visit | Attending: Cardiology | Admitting: Cardiology

## 2017-06-14 DIAGNOSIS — Z955 Presence of coronary angioplasty implant and graft: Secondary | ICD-10-CM

## 2017-06-14 DIAGNOSIS — Z48812 Encounter for surgical aftercare following surgery on the circulatory system: Secondary | ICD-10-CM | POA: Diagnosis not present

## 2017-06-16 ENCOUNTER — Encounter (HOSPITAL_COMMUNITY)
Admission: RE | Admit: 2017-06-16 | Discharge: 2017-06-16 | Disposition: A | Discharge status: Home / Self Care | Payer: PRIVATE HEALTH INSURANCE | Source: Ambulatory Visit | Attending: Cardiology | Admitting: Cardiology

## 2017-06-16 DIAGNOSIS — Z48812 Encounter for surgical aftercare following surgery on the circulatory system: Secondary | ICD-10-CM | POA: Diagnosis not present

## 2017-06-16 DIAGNOSIS — Z955 Presence of coronary angioplasty implant and graft: Secondary | ICD-10-CM

## 2017-06-18 ENCOUNTER — Encounter (HOSPITAL_COMMUNITY)
Admission: RE | Admit: 2017-06-18 | Discharge: 2017-06-18 | Disposition: A | Discharge status: Home / Self Care | Payer: PRIVATE HEALTH INSURANCE | Source: Ambulatory Visit | Attending: Cardiology | Admitting: Cardiology

## 2017-06-18 DIAGNOSIS — Z48812 Encounter for surgical aftercare following surgery on the circulatory system: Secondary | ICD-10-CM | POA: Diagnosis not present

## 2017-06-18 DIAGNOSIS — Z955 Presence of coronary angioplasty implant and graft: Secondary | ICD-10-CM

## 2017-06-23 ENCOUNTER — Encounter (HOSPITAL_COMMUNITY)
Admission: RE | Admit: 2017-06-23 | Discharge: 2017-06-23 | Disposition: A | Discharge status: Home / Self Care | Payer: PRIVATE HEALTH INSURANCE | Source: Ambulatory Visit | Attending: Cardiology | Admitting: Cardiology

## 2017-06-23 DIAGNOSIS — Z48812 Encounter for surgical aftercare following surgery on the circulatory system: Secondary | ICD-10-CM | POA: Insufficient documentation

## 2017-06-23 DIAGNOSIS — Z955 Presence of coronary angioplasty implant and graft: Secondary | ICD-10-CM | POA: Insufficient documentation

## 2017-06-25 ENCOUNTER — Encounter (HOSPITAL_COMMUNITY)
Admission: RE | Admit: 2017-06-25 | Discharge: 2017-06-25 | Disposition: A | Discharge status: Home / Self Care | Payer: PRIVATE HEALTH INSURANCE | Source: Ambulatory Visit | Attending: Cardiology | Admitting: Cardiology

## 2017-06-25 DIAGNOSIS — Z48812 Encounter for surgical aftercare following surgery on the circulatory system: Secondary | ICD-10-CM | POA: Diagnosis not present

## 2017-06-25 DIAGNOSIS — Z955 Presence of coronary angioplasty implant and graft: Secondary | ICD-10-CM

## 2017-06-28 ENCOUNTER — Encounter (HOSPITAL_COMMUNITY)
Admission: RE | Admit: 2017-06-28 | Discharge: 2017-06-28 | Disposition: A | Discharge status: Home / Self Care | Payer: PRIVATE HEALTH INSURANCE | Source: Ambulatory Visit | Attending: Cardiology | Admitting: Cardiology

## 2017-06-28 DIAGNOSIS — Z955 Presence of coronary angioplasty implant and graft: Secondary | ICD-10-CM

## 2017-06-30 ENCOUNTER — Encounter (HOSPITAL_COMMUNITY): Payer: PRIVATE HEALTH INSURANCE

## 2017-07-07 ENCOUNTER — Encounter (HOSPITAL_COMMUNITY)
Admission: RE | Admit: 2017-07-07 | Discharge: 2017-07-07 | Disposition: A | Discharge status: Home / Self Care | Payer: PRIVATE HEALTH INSURANCE | Source: Ambulatory Visit | Attending: Cardiology | Admitting: Cardiology

## 2017-07-07 DIAGNOSIS — Z955 Presence of coronary angioplasty implant and graft: Secondary | ICD-10-CM

## 2017-07-07 DIAGNOSIS — Z48812 Encounter for surgical aftercare following surgery on the circulatory system: Secondary | ICD-10-CM | POA: Diagnosis not present

## 2017-07-09 ENCOUNTER — Encounter (HOSPITAL_COMMUNITY)
Admission: RE | Admit: 2017-07-09 | Discharge: 2017-07-09 | Disposition: A | Discharge status: Home / Self Care | Payer: PRIVATE HEALTH INSURANCE | Source: Ambulatory Visit | Attending: Cardiology | Admitting: Cardiology

## 2017-07-09 DIAGNOSIS — Z48812 Encounter for surgical aftercare following surgery on the circulatory system: Secondary | ICD-10-CM | POA: Diagnosis not present

## 2017-07-09 DIAGNOSIS — Z955 Presence of coronary angioplasty implant and graft: Secondary | ICD-10-CM

## 2017-07-12 ENCOUNTER — Encounter (HOSPITAL_COMMUNITY)
Admission: RE | Admit: 2017-07-12 | Discharge: 2017-07-12 | Disposition: A | Discharge status: Home / Self Care | Payer: PRIVATE HEALTH INSURANCE | Source: Ambulatory Visit | Attending: Cardiology | Admitting: Cardiology

## 2017-07-12 DIAGNOSIS — Z955 Presence of coronary angioplasty implant and graft: Secondary | ICD-10-CM

## 2017-07-12 DIAGNOSIS — Z48812 Encounter for surgical aftercare following surgery on the circulatory system: Secondary | ICD-10-CM | POA: Diagnosis not present

## 2017-07-19 ENCOUNTER — Encounter (HOSPITAL_COMMUNITY)
Admission: RE | Admit: 2017-07-19 | Discharge: 2017-07-19 | Disposition: A | Discharge status: Home / Self Care | Payer: PRIVATE HEALTH INSURANCE | Source: Ambulatory Visit | Attending: Cardiology | Admitting: Cardiology

## 2017-07-19 DIAGNOSIS — Z955 Presence of coronary angioplasty implant and graft: Secondary | ICD-10-CM

## 2017-07-19 DIAGNOSIS — Z48812 Encounter for surgical aftercare following surgery on the circulatory system: Secondary | ICD-10-CM | POA: Diagnosis not present

## 2017-07-20 ENCOUNTER — Encounter (INDEPENDENT_AMBULATORY_CARE_PROVIDER_SITE_OTHER): Payer: Self-pay

## 2017-07-20 ENCOUNTER — Ambulatory Visit (INDEPENDENT_AMBULATORY_CARE_PROVIDER_SITE_OTHER): Payer: PRIVATE HEALTH INSURANCE | Admitting: Nurse Practitioner

## 2017-07-20 ENCOUNTER — Encounter: Payer: Self-pay | Admitting: Nurse Practitioner

## 2017-07-20 VITALS — BP 140/68 | HR 61 | Ht 71.0 in | Wt 206.4 lb

## 2017-07-20 DIAGNOSIS — I2510 Atherosclerotic heart disease of native coronary artery without angina pectoris: Secondary | ICD-10-CM

## 2017-07-20 DIAGNOSIS — E7849 Other hyperlipidemia: Secondary | ICD-10-CM

## 2017-07-20 NOTE — Patient Instructions (Addendum)
We will be checking the following labs today - BMET, CBC, HPF and lipids  Medication Instructions:    Continue with your current medicines.     Testing/Procedures To Be Arranged:  N/A  Follow-Up:   See Dr. Anne Fu in 6 months    Other Special Instructions:   N/A    If you need a refill on your cardiac medications before your next appointment, please call your pharmacy.   Call the [facility] office at [phone] if you have any questions, problems or concerns.

## 2017-07-20 NOTE — Progress Notes (Signed)
CARDIOLOGY OFFICE NOTE  Date:  07/20/2017    Colton Davidson Date of Birth: [birth year] Medical Record #[id number]  PCP:  Colton Blamer, MD  Cardiologist:  [facility]  Chief Complaint  Patient presents with  . Coronary Artery Disease    4 month check - seen for Dr. Anne Fu    History of Present Illness: Colton Davidson is a 61 y.o. male who presents today for a follow up visit. Seen for Dr. Anne Fu.   He has recently found CAD and has had LAD stent placed back in May of 2018. Minimal LV dysfunction was noted with anterior and apical hypokinesis. Normal EDP. EF was 45-50%.  FH is + for CAD. Other issues include past palpitations - has had prior Holter showing minimum heart rate of 34 bpm 5 AM average of 55 maximum of 91 with 129 PVCs and 188 PACs. 20 beats of atrial tachycardia at 1 45 bpm noted. He was taking diltiazem for this but is no longer taking.  Last seen back in June by Dr. Anne Fu and he was doing very well. Enjoying cardiac rehab.   Comes in today. Here alone. He is doing quite well - actually playing on 4 soccer teams. No chest pain. Not dizzy or lightheaded. He does not like the bruising from his DAPT. Overall, he feels like he is doing well.     Past Medical History:  Diagnosis Date  . Osteoarthritis    back  . Other specified cardiac dysrhythmias(427.89)    bradycardia  . Palpitations   . Paroxysmal supraventricular tachycardia [facility])     Past Surgical History:  Procedure Laterality Date  . CORONARY STENT INTERVENTION N/A 03/04/2017   Procedure: Coronary Stent Intervention;  Surgeon: Swaziland, Peter M, MD;  Location: [facility] INVASIVE CV LAB;  Service: Cardiovascular;  Laterality: N/A;  prox lad  . LEFT HEART CATH AND CORONARY ANGIOGRAPHY N/A 03/04/2017   Procedure: Left Heart Cath and Coronary Angiography;  Surgeon: Swaziland, Peter M, MD;  Location: [facility] INVASIVE CV LAB;  Service: Cardiovascular;  Laterality: N/A;  . NO PAST SURGERIES       Medications: Current Meds   Medication Sig  . aspirin EC 81 MG tablet Take 1 tablet (81 mg total) by mouth daily.  Marland Kitchen atorvastatin (LIPITOR) 80 MG tablet Take 1 tablet (80 mg total) by mouth daily at 6 PM.  . clopidogrel (PLAVIX) 75 MG tablet Take 1 tablet (75 mg total) by mouth daily.  . Clotrimazole (LOTRIMIN AF EX) Apply 1 application topically daily as needed (athlete's foot).  . fluticasone (FLONASE) 50 MCG/ACT nasal spray Place 1 spray into both nostrils at bedtime as needed for allergies or rhinitis.  Marland Kitchen nitroGLYCERIN (NITROSTAT) 0.4 MG SL tablet Place 1 tablet (0.4 mg total) under the tongue every 5 (five) minutes as needed for chest pain.     Allergies: No Known Allergies  Social History: The patient  reports that he has never smoked. He has never used smokeless tobacco. He reports that he drinks alcohol. He reports that he does not use drugs.   Family History: The patient's family history includes Heart attack in his father; Heart disease in his father; Heart failure in his father; Hyperlipidemia in his mother.   Review of Systems: Please see the history of present illness.   Otherwise, the review of systems is positive for none.   All other systems are reviewed and negative.   Physical Exam: VS:  BP 140/68 (BP Location: Left Arm, Patient Position:  Sitting, Cuff Size: Normal)   Pulse 61   Ht  (1.803 m)   Wt 206 lb 6.4 oz (93.6 kg)   SpO2 98% Comment: at rest  BMI 28.79 kg/m  .  BMI Body mass index is 28.79 kg/m.  Wt Readings from Last 3 Encounters:  07/20/17 206 lb 6.4 oz (93.6 kg)  03/23/17 210 lb (95.3 kg)  03/04/17 202 lb (91.6 kg)   Recheck by me is 130/80  General: Pleasant. Well developed, well nourished and in no acute distress.   HEENT: Normal.  Neck: Supple, no JVD, carotid bruits, or masses noted.  Cardiac: Regular rate and rhythm. No murmurs, rubs, or gallops. No edema.  Respiratory:  Lungs are clear to auscultation bilaterally with normal work of breathing.  GI: Soft and  nontender.  MS: No deformity or atrophy. Gait and ROM intact.  Skin: Warm and dry. Color is normal.  Neuro:  Strength and sensation are intact and no gross focal deficits noted.  Psych: Alert, appropriate and with normal affect.   LABORATORY DATA:  EKG:  EKG is not ordered today.  Lab Results  Component Value Date   WBC 5.2 02/23/2017   HGB 14.0 02/23/2017   HCT 40.8 02/23/2017   PLT 210 02/23/2017   GLUCOSE 99 02/23/2017   CHOL 177 02/24/2016   TRIG 88 02/24/2016   HDL 54 02/24/2016   LDLCALC 105 02/24/2016   ALT 19 02/24/2016   AST 21 02/24/2016   NA 140 02/23/2017   K 4.7 02/23/2017   CL 102 02/23/2017   CREATININE 0.96 02/23/2017   BUN 15 02/23/2017   CO2 22 02/23/2017   INR 1.0 02/23/2017     BNP (last 3 results) No results for input(s): BNP in the last 8760 hours.  ProBNP (last 3 results) No results for input(s): PROBNP in the last 8760 hours.   Other Studies Reviewed Today: LHC: 03/04/17  Conclusion     Mid LAD to Dist LAD lesion, 20 %stenosed.  There is mild left ventricular systolic dysfunction.  LV end diastolic pressure is normal.  The left ventricular ejection fraction is 45-50% by visual estimate.  Prox LAD lesion, 95 %stenosed.  A STENT RESOLUTE ONYX 4.0X15 drug eluting stent was successfully placed.  Post intervention, there is a 0% residual stenosis.  1. Single vessel obstructive CAD. 95% proximal LAD 2. Mild LV dysfunction with anterior and apical HK 3. Normal LVEDP. 4. Successful stenting of the proximal LAD with DES.   Plan: DAPT for one year with ASA/Plavix. High dose statin therapy. Anticipate same day DC       Assessment/Plan:  1. Coronary artery disease - prior stent of proximal LAD disease from 02/2017 - on DAPT. Minimally reduced EF at 45 to 50%. He is doing great clinically. No changes made.   2. Sinus bradycardia - HR is ok here today - no symptoms noted.   3. Paroxysmal atrial tachycardia -  - Previously  seen on Holter monitor. Not an issue  4. HLD - on statin - no lipids checked in almost a year and a half - he has not had lunch - will check today. He is on high dose statin.   5. Bruising - from DAPT and his strenuous activity.   Current medicines are reviewed with the patient today.  The patient does not have concerns regarding medicines other than what has been noted above.  The following changes have been made:  See above.  Labs/ tests ordered today include:  Orders Placed This Encounter  Procedures  . Basic metabolic panel  . CBC  . Hepatic function panel  . Lipid panel     Disposition:   FU with Dr. Anne Fu in 6 months. Lab today. Overall, he is felt to be doing well.   Patient is agreeable to this plan and will call if any problems develop in the interim.   SignedNorma Fredrickson, NP  07/20/2017 3:06 PM  [facility] [address] [place], [place]  [address] Phone: [phone] Fax: [phone]

## 2017-07-21 LAB — CBC
Hematocrit: 41.5 % (ref 37.5–51.0)
Hemoglobin: 14.2 g/dL (ref 13.0–17.7)
MCH: 32.6 pg (ref 26.6–33.0)
MCHC: 34.2 g/dL (ref 31.5–35.7)
MCV: 95 fL (ref 79–97)
Platelets: 204 10*3/uL (ref 150–379)
RBC: 4.36 x10E6/uL (ref 4.14–5.80)
RDW: 13.3 % (ref 12.3–15.4)
WBC: 6.6 10*3/uL (ref 3.4–10.8)

## 2017-07-21 LAB — HEPATIC FUNCTION PANEL
ALT: 33 IU/L (ref 0–44)
AST: 38 IU/L (ref 0–40)
Albumin: 4.6 g/dL (ref 3.5–5.5)
Alkaline Phosphatase: 63 IU/L (ref 39–117)
Bilirubin Total: 0.6 mg/dL (ref 0.0–1.2)
Bilirubin, Direct: 0.18 mg/dL (ref 0.00–0.40)
Total Protein: 7 g/dL (ref 6.0–8.5)

## 2017-07-21 LAB — BASIC METABOLIC PANEL
BUN/Creatinine Ratio: 15 (ref 9–20)
BUN: 16 mg/dL (ref 6–24)
CO2: 21 mmol/L (ref 20–29)
Calcium: 9.3 mg/dL (ref 8.7–10.2)
Chloride: 107 mmol/L — ABNORMAL HIGH (ref 96–106)
Creatinine, Ser: 1.04 mg/dL (ref 0.76–1.27)
GFR calc Af Amer: 91 mL/min/{1.73_m2} (ref >59)
GFR calc non Af Amer: 79 mL/min/{1.73_m2} (ref >59)
Glucose: 92 mg/dL (ref 65–99)
Potassium: 4.4 mmol/L (ref 3.5–5.2)
Sodium: 144 mmol/L (ref 134–144)

## 2017-07-21 LAB — LIPID PANEL
Chol/HDL Ratio: 1.6 ratio (ref 0.0–5.0)
Cholesterol, Total: 127 mg/dL (ref 100–199)
HDL: 81 mg/dL (ref >39)
LDL Calculated: 29 mg/dL (ref 0–99)
Triglycerides: 83 mg/dL (ref 0–149)
VLDL Cholesterol Cal: 17 mg/dL (ref 5–40)

## 2017-07-23 ENCOUNTER — Encounter (HOSPITAL_COMMUNITY)
Admission: RE | Admit: 2017-07-23 | Discharge: 2017-07-23 | Disposition: A | Discharge status: Home / Self Care | Payer: PRIVATE HEALTH INSURANCE | Source: Ambulatory Visit | Attending: Cardiology | Admitting: Cardiology

## 2017-07-23 DIAGNOSIS — Z955 Presence of coronary angioplasty implant and graft: Secondary | ICD-10-CM

## 2017-07-23 DIAGNOSIS — Z48812 Encounter for surgical aftercare following surgery on the circulatory system: Secondary | ICD-10-CM | POA: Diagnosis not present

## 2017-07-26 ENCOUNTER — Encounter (HOSPITAL_COMMUNITY)
Admission: RE | Admit: 2017-07-26 | Discharge: 2017-07-26 | Disposition: A | Discharge status: Home / Self Care | Payer: PRIVATE HEALTH INSURANCE | Source: Ambulatory Visit | Attending: Cardiology | Admitting: Cardiology

## 2017-07-26 ENCOUNTER — Encounter (HOSPITAL_COMMUNITY): Payer: Self-pay

## 2017-07-26 VITALS — BP 136/70 | HR 62 | Ht 70.0 in | Wt 207.2 lb

## 2017-07-26 DIAGNOSIS — Z48812 Encounter for surgical aftercare following surgery on the circulatory system: Secondary | ICD-10-CM | POA: Diagnosis not present

## 2017-07-26 DIAGNOSIS — Z955 Presence of coronary angioplasty implant and graft: Secondary | ICD-10-CM

## 2017-08-10 NOTE — Progress Notes (Signed)
Discharge Progress Report  Patient Details  Name: Colton Davidson MRN: [id number] Date of Birth: September 21, 1956 Referring Provider:     [place] from 03/25/2017 in [place]  Referring Provider  Candee Furbish MD       Number of Visits: 33   Reason for Discharge:  Patient has met program and personal goals.  Smoking History:  History  Smoking Status  . Never Smoker  Smokeless Tobacco  . Never Used    Diagnosis:  03/04/17 DES LAD  ADL UCSD:   Initial Exercise Prescription:     Initial Exercise Prescription - 03/25/17 0900      Date of Initial Exercise RX and Referring Provider   Date 03/25/17   Referring Provider Candee Furbish MD     Treadmill   MPH 3.5   Grade 3   Minutes 10   METs 5.13     Bike   Level 1.2   Minutes 10   METs 3.4     Rower   Level 6   Watts 65   Minutes 10   METs 5.5     Prescription Details   Frequency (times per week) 3   Duration Progress to 45 minutes of aerobic exercise without signs/symptoms of physical distress     Intensity   THRR 40-80% of Max Heartrate 65-130   Ratings of Perceived Exertion 11-15   Perceived Dyspnea 0-4     Progression   Progression Continue to progress workloads to maintain intensity without signs/symptoms of physical distress.     Resistance Training   Training Prescription Yes   Weight 5   Reps 10-15      Discharge Exercise Prescription (Final Exercise Prescription Changes):     Exercise Prescription Changes - 07/26/17 0800      Response to Exercise   Blood Pressure (Admit) 136/70   Blood Pressure (Exercise) 170/84   Blood Pressure (Exit) 112/82   Heart Rate (Admit) 62 bpm   Heart Rate (Exercise) 133 bpm   Heart Rate (Exit) 51 bpm   Rating of Perceived Exertion (Exercise) 14   Symptoms none   Duration Progress to 45 minutes of aerobic exercise without signs/symptoms of physical distress   Intensity THRR unchanged     Progression    Progression Continue to progress workloads to maintain intensity without signs/symptoms of physical distress.   Average METs 8.3     Resistance Training   Training Prescription Yes   Weight 9lbs   Reps 10-15   Time 10 Minutes     Interval Training   Interval Training No     Treadmill   MPH 3.5   Grade 3   Minutes 10   METs 5.13     Bike   Level 4.7   Minutes 10   METs 10.43     Rower   Level 6   Watts 160   Minutes 10   METs 9.2     Home Exercise Plan   Plans to continue exercise at Home (comment)   Frequency Add 3 additional days to program exercise sessions.   Initial Home Exercises Provided 04/14/17      Functional Capacity:     6 Minute Walk    Row Name 03/25/17 0936 07/19/17 0722       6 Minute Walk   Phase Initial Discharge    Distance 1900 feet 2000 feet    Distance % Change  - 5.26 %  Distance Feet Change  - 100 ft    Walk Time 6 minutes 6 minutes    # of Rest Breaks 0 0    MPH 3.6 3.79    METS 4.4 4.4    RPE 10 11    VO2 Peak 15.4 15.4    Symptoms No No    Resting HR 51 bpm 70 bpm    Resting BP 136/78 148/80    Max Ex. HR 80 bpm 83 bpm    Max Ex. BP 150/86 112/82    2 Minute Post BP 122/76  -       Psychological, QOL, Others - Outcomes: PHQ 2/9: Depression screen [facility] 2/9 07/26/2017 04/02/2017  Decreased Interest 0 0  Down, Depressed, Hopeless 0 0  PHQ - 2 Score 0 0    Quality of Life:     Quality of Life - 07/26/17 0715      Quality of Life Scores   Health/Function Pre 26.17 %   Health/Function Post 27.2 %   Health/Function % Change 3.94 %   Socioeconomic Pre 26.64 %   Socioeconomic Post 27.14 %   Socioeconomic % Change  1.88 %   Psych/Spiritual Pre 23.29 %   Psych/Spiritual Post 22.64 %   Psych/Spiritual % Change -2.79 %   Family Pre 25.9 %   Family Post 27.3 %   Family % Change 5.41 %   GLOBAL Pre 25.63 %   GLOBAL Post 26.26 %   GLOBAL % Change 2.46 %      Personal Goals: Goals established at orientation  with interventions provided to work toward goal.     Personal Goals and Risk Factors at Admission - 04/06/17 0942      Core Components/Risk Factors/Patient Goals on Admission    Weight Management Weight Loss;Yes   Intervention Weight Management: Develop a combined nutrition and exercise program designed to reach desired caloric intake, while maintaining appropriate intake of nutrient and fiber, sodium and fats, and appropriate energy expenditure required for the weight goal.;Weight Management: Provide education and appropriate resources to help participant work on and attain dietary goals.;Weight Management/Obesity: Establish reasonable short term and long term weight goals.;Obesity: Provide education and appropriate resources to help participant work on and attain dietary goals.   Admit Weight 207 lb 3.7 oz (94 kg)   Goal Weight: Short Term 204 lb (92.5 kg)   Goal Weight: Long Term 197 lb (89.4 kg)   Expected Outcomes Weight Maintenance: Understanding of the daily nutrition guidelines, which includes 25-35% calories from fat, 7% or less cal from saturated fats, less than 215m cholesterol, less than 1.5gm of sodium, & 5 or more servings of fruits and vegetables daily;Short Term: Continue to assess and modify interventions until short term weight is achieved;Long Term: Adherence to nutrition and physical activity/exercise program aimed toward attainment of established weight goal;Weight Loss: Understanding of general recommendations for a balanced deficit meal plan, which promotes 1-2 lb weight loss per week and includes a negative energy balance of [phone] kcal/d;Understanding recommendations for meals to include 15-35% energy as protein, 25-35% energy from fat, 35-60% energy from carbohydrates, less than 207mof dietary cholesterol, 20-35 gm of total fiber daily;Understanding of distribution of calorie intake throughout the day with the consumption of 4-5 meals/snacks   Stress Yes   Personal Goal  Other Yes   Personal Goal Learn more about heart healthy eating/nutrition   Intervention Attend Tuesday nutrition classess   Expected Outcomes Pt will develop an improved understanding of heart healthy  nutrition as evident by his food selection, choices and reaching desired weight.       Personal Goals Discharge:     Goals and Risk Factor Review    Row Name 04/06/17 7564 05/04/17 1537 06/01/17 1042 06/15/17 1518 07/26/17 0739     Core Components/Risk Factors/Patient Goals Review   Personal Goals Review Weight Management/Obesity;Other;Stress Weight Management/Obesity;Other;Stress Weight Management/Obesity;Other;Stress Weight Management/Obesity;Other;Stress Weight Management/Obesity;Other;Stress   Review pt demonstrates eagerness to participate in CR activities. pt expressed personal goal of learning heart healthy nutrition. pt has been encouraged to participate in Tuesday nutrition classes.  pt demonstrates eagerness to participate in CR activities. pt expressed personal goal of learning heart healthy nutrition. pt has been encouraged to participate in Tuesday nutrition classes.  pt demonstrates eagerness to participate in CR activities. pt expressed personal goal of learning heart healthy nutrition. pt has been encouraged to participate in Tuesday nutrition classes.  pt demonstrates eagerness to participate in CR activities. pt expressed personal goal of learning heart healthy nutrition. pt has been encouraged to participate in Tuesday nutrition classes.  pt graduated from [employer] program with 36 sessions. pt had excellent participation and has been commended on his success.  pt has developed healthy exercise regimen, learning safe workload parameters to maintain appropriate blood pressure control.      Expected Outcomes pt will participate in CR exercise, nutrition, and lifestyle education opportunities to decrease overall CAD risk factors.  pt will participate in CR exercise, nutrition, and lifestyle  education opportunities to decrease overall CAD risk factors.  pt will participate in CR exercise, nutrition, and lifestyle education opportunities to decrease overall CAD risk factors.  pt will participate in CR exercise, nutrition, and lifestyle education opportunities to decrease overall CAD risk factors.  pt will continue exercise, nutrition and lifestyle modifications to decrease overall CAD risk factors.        Exercise Goals and Review:     Exercise Goals    Row Name 03/25/17 0843 06/22/17 1352 06/22/17 1356         Exercise Goals   Increase Physical Activity Yes  -  -     Intervention Provide advice, education, support and counseling about physical activity/exercise needs.;Develop an individualized exercise prescription for aerobic and resistive training based on initial evaluation findings, risk stratification, comorbidities and participant's personal goals.  -  -     Expected Outcomes Achievement of increased cardiorespiratory fitness and enhanced flexibility, muscular endurance and strength shown through measurements of functional capacity and personal statement of participant.  -  -     Increase Strength and Stamina Yes  -  -     Intervention Develop an individualized exercise prescription for aerobic and resistive training based on initial evaluation findings, risk stratification, comorbidities and participant's personal goals.;Provide advice, education, support and counseling about physical activity/exercise needs.  -  -     Expected Outcomes Achievement of increased cardiorespiratory fitness and enhanced flexibility, muscular endurance and strength shown through measurements of functional capacity and personal statement of participant.  -  -     Able to understand and use rate of perceived exertion (RPE) scale  - Yes  -     Knowledge and understanding of Target Heart Rate Range (THRR)  -  - (P)  Yes        Nutrition & Weight - Outcomes:     Pre Biometrics - 03/25/17 0944       Pre Biometrics   Waist Circumference 38.5 inches   Hip Circumference  41.5 inches   Waist to Hip Ratio 0.93 %   Triceps Skinfold 18 mm   % Body Fat 28 %   Grip Strength 40 kg   Flexibility 8.5 in   Single Leg Stand 30 seconds         Post Biometrics - 07/26/17 0800       Post  Biometrics   Height '5\' 10"'  (1.778 m)   Weight 207 lb 3.7 oz (94 kg)   Waist Circumference 37.75 inches   Hip Circumference 40.75 inches   Waist to Hip Ratio 0.93 %   BMI (Calculated) 29.73   Triceps Skinfold 16 mm   % Body Fat 26.9 %   Grip Strength 47 kg   Flexibility 8 in   Single Leg Stand 30 seconds      Nutrition:     Nutrition Therapy & Goals - 05/19/17 0846      Nutrition Therapy   Diet Therapeutic Lifestyle Changes     Personal Nutrition Goals   Nutrition Goal Wt loss of 1-2 lb per week to a wt loss goal of 6-10 lb at graduation from Cardiac Rehab.      Intervention Plan   Intervention Prescribe, educate and counsel regarding individualized specific dietary modifications aiming towards targeted core components such as weight, hypertension, lipid management, diabetes, heart failure and other comorbidities.   Expected Outcomes Short Term Goal: Understand basic principles of dietary content, such as calories, fat, sodium, cholesterol and nutrients.;Long Term Goal: Adherence to prescribed nutrition plan.      Nutrition Discharge:     Nutrition Assessments - 07/28/17 1503      MEDFICTS Scores   Pre Score 46   Post Score 12   Score Difference -34      Education Questionnaire Score:     Knowledge Questionnaire Score - 07/26/17 0716      Knowledge Questionnaire Score   Pre Score 20/24   Post Score 22/24      Goals reviewed with patient; copy given to patient.

## 2017-10-07 ENCOUNTER — Other Ambulatory Visit: Payer: Self-pay | Admitting: Cardiology

## 2017-10-07 ENCOUNTER — Encounter (INDEPENDENT_AMBULATORY_CARE_PROVIDER_SITE_OTHER): Payer: Self-pay

## 2017-10-07 MED ORDER — ATORVASTATIN CALCIUM 80 MG PO TABS: 80.0000 mg | ORAL_TABLET | Freq: Every day | ORAL | 9 refills | Status: DC

## 2017-10-07 NOTE — Telephone Encounter (Signed)
Pt's medication was sent to pt's pharmacy as requested. Confirmation received.  °

## 2018-01-06 ENCOUNTER — Encounter: Payer: Self-pay | Admitting: *Deleted

## 2018-01-17 ENCOUNTER — Ambulatory Visit: Payer: 59 | Admitting: Cardiology

## 2018-01-17 ENCOUNTER — Encounter: Payer: Self-pay | Admitting: Cardiology

## 2018-01-17 VITALS — BP 126/84 | HR 50 | Ht 71.0 in | Wt 218.8 lb

## 2018-01-17 DIAGNOSIS — I471 Supraventricular tachycardia: Secondary | ICD-10-CM

## 2018-01-17 DIAGNOSIS — I2510 Atherosclerotic heart disease of native coronary artery without angina pectoris: Secondary | ICD-10-CM | POA: Diagnosis not present

## 2018-01-17 MED ORDER — ATORVASTATIN CALCIUM 80 MG PO TABS: 40.0000 mg | ORAL_TABLET | Freq: Every day | ORAL | 9 refills | Status: DC

## 2018-01-17 NOTE — Patient Instructions (Signed)
Medication Instructions:  Please discontinue your Plavix after Mar 05, 2018. Decrease Atorvastatin to 40 mg a day. Continue all other medications as listed.  Follow-Up: Follow up in 1 year with Dr. Anne FuSkains.  You will receive a letter in the mail 2 months before you are due.  Please call us when you receive this letter to schedule your follow up appointment.  If you need a refill on your cardiac medications before your next appointment, please call your pharmacy.  Thank you for choosing [facility]!!

## 2018-01-17 NOTE — Progress Notes (Signed)
Cardiology Office Note:    Date:  01/17/2018   ID:  Colton Davidson, DOB [birth year], MRN [id number]  PCP:  Johny Blamer, MD  Cardiologist:  Donato Schultz, MD    Referring MD: Johny Blamer, MD     History of Present Illness:    Colton Davidson is a 62 y.o. male here for follow up of CAD post prox LAD stent.  He came to see me on 02/23/17 and was stating that he was having chest tightness and burning when heavily exerting himself with radiation to his neck and jaw that seem to be relieved when he decreased his effort. He was able to reduplicate the symptoms. His EKG on 02/23/17 showed new T-wave inversion in V3, biphasic as well as V4 through V6. They were concerning for ischemia. He also felt some associated shortness of breath. They began about 10 minutes and exercise when pushing himself on stairmaster or elliptical machine for instance.  His father had myocardial infarction at age 75s, CABG in his 47s, mother is currently alive in her 46s.  Plays soccer.  Previously had an evaluation for palpitations with Holter monitor demonstrating minimum heart rate of 34 bpm 5 AM average of 55 maximum of 91 with 129 PVCs and 188 PACs. 20 beats of atrial tachycardia at 1 45 bpm noted. He was taking diltiazem for this but is no longer taking.  He was set up for cardiac catheterization and have this performed on 03/04/17 by Dr. Peter Swaziland. Proximal LAD disease was noted and successfully stented. Minimal LV dysfunction was noted with anterior and apical hypokinesis. Normal EDP. EF was 45-50%.  His parents lived into their 10s. 3 children are going to schools, [employer], private college in [place], [employer] now [employer].  01/17/18 -overall doing well without any complaints, no syncope bleeding orthopnea PND. No palpitations.  Previously had some paroxysmal atrial tachycardia.  His LDL cholesterol is 29.  Lipitor is currently at 80, we will bring down to 40.  Since it is been  approximately 1 year, we will transition him over to aspirin only, stop the Plavix.  He is still playing quite a bit of soccer.  Active, more active than he has been.  Doing very well.  No anginal symptoms.  His children are doing well as well.  Increased stress at home, mother-in-law is now living with them on hospice.  Past Medical History:  Diagnosis Date  . Osteoarthritis    back  . Other specified cardiac dysrhythmias(427.89)    bradycardia  . Palpitations   . Paroxysmal supraventricular tachycardia [facility])     Past Surgical History:  Procedure Laterality Date  . CORONARY STENT INTERVENTION N/A 03/04/2017   Procedure: Coronary Stent Intervention;  Surgeon: Swaziland, Peter M, MD;  Location: [facility] INVASIVE CV LAB;  Service: Cardiovascular;  Laterality: N/A;  prox lad  . LEFT HEART CATH AND CORONARY ANGIOGRAPHY N/A 03/04/2017   Procedure: Left Heart Cath and Coronary Angiography;  Surgeon: Swaziland, Peter M, MD;  Location: [facility] INVASIVE CV LAB;  Service: Cardiovascular;  Laterality: N/A;  . NO PAST SURGERIES      Current Medications: Current Meds  Medication Sig  . aspirin EC 81 MG tablet Take 1 tablet (81 mg total) by mouth daily.  Marland Kitchen atorvastatin (LIPITOR) 80 MG tablet Take 0.5 tablets (40 mg total) by mouth daily at 6 PM.  . clopidogrel (PLAVIX) 75 MG tablet Take 1 tablet (75 mg total) by mouth daily.  . Clotrimazole (LOTRIMIN AF  EX) Apply 1 application topically daily as needed (athlete's foot).  . fluticasone (FLONASE) 50 MCG/ACT nasal spray Place 1 spray into both nostrils at bedtime as needed for allergies or rhinitis.  Marland Kitchen. nitroGLYCERIN (NITROSTAT) 0.4 MG SL tablet Place 1 tablet (0.4 mg total) under the tongue every 5 (five) minutes as needed for chest pain.  . [DISCONTINUED] atorvastatin (LIPITOR) 80 MG tablet Take 1 tablet (80 mg total) by mouth daily at 6 PM.     Allergies:   Patient has no known allergies.   Social History   Socioeconomic History  . Marital status: Married     Spouse name: Not on file  . Number of children: Not on file  . Years of education: Not on file  . Highest education level: Not on file  Occupational History  . Not on file  Social Needs  . Financial resource strain: Not on file  . Food insecurity:    Worry: Not on file    Inability: Not on file  . Transportation needs:    Medical: Not on file    Non-medical: Not on file  Tobacco Use  . Smoking status: Never Smoker  . Smokeless tobacco: Never Used  Substance and Sexual Activity  . Alcohol use: Yes  . Drug use: No  . Sexual activity: Not on file  Lifestyle  . Physical activity:    Days per week: Not on file    Minutes per session: Not on file  . Stress: Not on file  Relationships  . Social connections:    Talks on phone: Not on file    Gets together: Not on file    Attends religious service: Not on file    Active member of club or organization: Not on file    Attends meetings of clubs or organizations: Not on file    Relationship status: Not on file  Other Topics Concern  . Not on file  Social History Narrative  . Not on file     Family History: The patient's family history includes Heart attack in his father; Heart disease in his father; Heart failure in his father; Hyperlipidemia in his mother. ROS:   Please see the history of present illness.   All other review of systems negative.  EKGs/Labs/Other Studies Reviewed:    The following studies were reviewed today:  LHC: 03/04/17  Conclusion     Mid LAD to Dist LAD lesion, 20 %stenosed.  There is mild left ventricular systolic dysfunction.  LV end diastolic pressure is normal.  The left ventricular ejection fraction is 45-50% by visual estimate.  Prox LAD lesion, 95 %stenosed.  A STENT RESOLUTE ONYX 4.0X15 drug eluting stent was successfully placed.  Post intervention, there is a 0% residual stenosis.  1. Single vessel obstructive CAD. 95% proximal LAD 2. Mild LV dysfunction with anterior and  apical HK 3. Normal LVEDP. 4. Successful stenting of the proximal LAD with DES.   Plan: DAPT for one year with ASA/Plavix. High dose statin therapy. Anticipate same day DC     EKG:  EKG is not ordered today.    Recent Labs: 07/20/2017: ALT 33; BUN 16; Creatinine, Ser 1.04; Hemoglobin 14.2; Platelets 204; Potassium 4.4; Sodium 144   Recent Lipid Panel    Component Value Date/Time   CHOL 127 07/20/2017 1504   TRIG 83 07/20/2017 1504   HDL 81 07/20/2017 1504   CHOLHDL 1.6 07/20/2017 1504   CHOLHDL 3.3 02/24/2016 0747   VLDL 18 02/24/2016 0747  LDLCALC 29 07/20/2017 1504    Physical Exam:    VS:  BP 126/84   Pulse (!) 50   Ht 5\' 11"  (1.803 m)   Wt 218 lb 12.8 oz (99.2 kg)   SpO2 99%   BMI 30.52 kg/m     Wt Readings from Last 3 Encounters:  01/17/18 218 lb 12.8 oz (99.2 kg)  07/26/17 207 lb 3.7 oz (94 kg)  07/20/17 206 lb 6.4 oz (93.6 kg)     GEN: Well nourished, well developed, in no acute distress  HEENT: normal  Neck: no JVD, carotid bruits, or masses Cardiac: RRR; no murmurs, rubs, or gallops,no edema  Respiratory:  clear to auscultation bilaterally, normal work of breathing GI: soft, nontender, nondistended, + BS MS: no deformity or atrophy  Skin: warm and dry, no rash Neuro:  Alert and Oriented x 3, Strength and sensation are intact Psych: euthymic mood, full affect   ASSESSMENT:    1. Coronary artery disease involving native coronary artery of native heart without angina pectoris   2. Paroxysmal atrial tachycardia (HCC)    PLAN:    In order of problems listed above:  Coronary artery disease/angina  - Successful stent of proximal LAD disease relieving anginal symptoms.  - Dual antiplatelet therapy for at least one year, stent placed on 03/04/17.  - He had same day discharge.  - EF minimally reduced at 45-50%.  -Enjoyed cardiac rehab.  LDL 29.  Changing to 40 mg from 80 mg of atorvastatin.  This is still a high intensity statin dose.  He has been  experiencing some cramping in his calves when playing soccer.  Encouraged hydration.  Sinus bradycardia  - Normal resting heart rate ranges between 40 and 50. He is asymptomatic. No pacemaker.  Doing very well.  Athletic.  Paroxysmal atrial tachycardia  - Previously seen on Holter monitor. Stable.  No symptoms.     Medication Adjustments/Labs and Tests Ordered: Current medicines are reviewed at length with the patient today.  Concerns regarding medicines are outlined above. Labs and tests ordered and medication changes are outlined in the patient instructions below:  Patient Instructions  Medication Instructions:  Please discontinue your Plavix after Mar 05, 2018. Decrease Atorvastatin to 40 mg a day. Continue all other medications as listed.  Follow-Up: Follow up in 1 year with Dr. Anne Fu.  You will receive a letter in the mail 2 months before you are due.  Please call us when you receive this letter to schedule your follow up appointment.  If you need a refill on your cardiac medications before your next appointment, please call your pharmacy.  Thank you for choosing [facility]!!        Signed, Donato Schultz, MD  01/17/2018 9:26 AM    [facility]

## 2018-07-15 ENCOUNTER — Other Ambulatory Visit: Payer: Self-pay | Admitting: Orthopedic Surgery

## 2018-07-15 DIAGNOSIS — M25562 Pain in left knee: Secondary | ICD-10-CM

## 2018-07-15 DIAGNOSIS — R609 Edema, unspecified: Secondary | ICD-10-CM

## 2018-07-15 DIAGNOSIS — R531 Weakness: Secondary | ICD-10-CM

## 2018-07-23 ENCOUNTER — Ambulatory Visit
Admission: RE | Admit: 2018-07-23 | Discharge: 2018-07-23 | Disposition: A | Discharge status: Home / Self Care | Payer: 59 | Source: Ambulatory Visit | Attending: Orthopedic Surgery | Admitting: Orthopedic Surgery

## 2018-07-23 DIAGNOSIS — M25562 Pain in left knee: Secondary | ICD-10-CM

## 2018-07-23 DIAGNOSIS — R531 Weakness: Secondary | ICD-10-CM

## 2018-07-23 DIAGNOSIS — R609 Edema, unspecified: Secondary | ICD-10-CM

## 2018-08-27 ENCOUNTER — Other Ambulatory Visit: Payer: Self-pay | Admitting: Cardiology

## 2019-01-05 ENCOUNTER — Telehealth: Payer: Self-pay

## 2019-01-05 NOTE — Telephone Encounter (Signed)
Pt was contacted and history reviewed. Pt agreeable to postpone 29yr f/u appt on 3/26 d/t COVID19  Pt has no cardiac symptoms at this time Pt advised to call back if any symptoms present Pt verbalized understanding Will route to COVID cancel pool

## 2019-01-12 ENCOUNTER — Ambulatory Visit: Payer: 59 | Admitting: Cardiology

## 2019-01-26 ENCOUNTER — Encounter: Payer: Self-pay | Admitting: *Deleted

## 2019-01-26 NOTE — Telephone Encounter (Signed)
Pt has agreed for virtual / video via webex, 02/03/2019.     Virtual Visit Pre-Appointment Phone Call  Steps For Call:  1. Confirm consent - "In the setting of the current Covid19 crisis, you are scheduled for a (phone or video) visit with your provider on (date) at (time).  Just as we do with many in-office visits, in order for you to participate in this visit, we must obtain consent.  If you'd like, I can send this to your mychart (if signed up) or email for you to review.  Otherwise, I can obtain your verbal consent now.  All virtual visits are billed to your insurance company just like a normal visit would be.  By agreeing to a virtual visit, we'd like you to understand that the technology does not allow for your provider to perform an examination, and thus may limit your provider's ability to fully assess your condition.  Finally, though the technology is pretty good, we cannot assure that it will always work on either your or our end, and in the setting of a video visit, we may have to convert it to a phone-only visit.  In either situation, we cannot ensure that we have a secure connection.  Are you willing to proceed?"  2. Give patient instructions for WebEx download to smartphone as below if video visit  3. Advise patient to be prepared with any vital sign or heart rhythm information, their current medicines, and a piece of paper and pen handy for any instructions they may receive the day of their visit  4. Inform patient they will receive a phone call 15 minutes prior to their appointment time (may be from unknown caller ID) so they should be prepared to answer  5. Confirm that appointment type is correct in Epic appointment notes (video vs telephone)    TELEPHONE CALL NOTE  Colton Davidson has been deemed a candidate for a follow-up tele-health visit to limit community exposure during the Covid-19 pandemic. I spoke with the patient via phone to ensure availability of phone/video  source, confirm preferred email & phone number, and discuss instructions and expectations.  I reminded Colton Davidson to be prepared with any vital sign and/or heart rhythm information that could potentially be obtained via home monitoring, at the time of his visit. I reminded Colton Davidson to expect a phone call at the time of his visit if his visit.  Did the patient verbally acknowledge consent to treatment? 02/03/2019  Elliot Cousin, RMA 01/26/2019 11:21 AM   DOWNLOADING THE WEBEX SOFTWARE TO SMARTPHONE  - If Apple, go to [employer] and type in WebEx in the search bar. Download [employer], the blue/green circle. The app is free but as with any other app downloads, their phone may require them to verify saved payment information or Apple password. The patient does NOT have to create an account.  - If Android, ask patient to go to [employer] and type in WebEx in the search bar. Download [employer], the blue/green circle. The app is free but as with any other app downloads, their phone may require them to verify saved payment information or Android password. The patient does NOT have to create an account.   CONSENT FOR TELE-HEALTH VISIT - PLEASE REVIEW  I hereby voluntarily request, consent and authorize [facility] and its employed or contracted physicians, physician assistants, nurse practitioners or other licensed health care professionals (the Practitioner), to provide me with telemedicine health  care services (the "Services") as deemed necessary by the treating Practitioner. I acknowledge and consent to receive the Services by the Practitioner via telemedicine. I understand that the telemedicine visit will involve communicating with the Practitioner through live audiovisual communication technology and the disclosure of certain medical information by electronic transmission. I acknowledge that I have been given the opportunity to request an in-person assessment  or other available alternative prior to the telemedicine visit and am voluntarily participating in the telemedicine visit.  I understand that I have the right to withhold or withdraw my consent to the use of telemedicine in the course of my care at any time, without affecting my right to future care or treatment, and that the Practitioner or I may terminate the telemedicine visit at any time. I understand that I have the right to inspect all information obtained and/or recorded in the course of the telemedicine visit and may receive copies of available information for a reasonable fee.  I understand that some of the potential risks of receiving the Services via telemedicine include:  Marland Kitchen. Delay or interruption in medical evaluation due to technological equipment failure or disruption; . Information transmitted may not be sufficient (e.g. poor resolution of images) to allow for appropriate medical decision making by the Practitioner; and/or  . In rare instances, security protocols could fail, causing a breach of personal health information.  Furthermore, I acknowledge that it is my responsibility to provide information about my medical history, conditions and care that is complete and accurate to the best of my ability. I acknowledge that Practitioner's advice, recommendations, and/or decision may be based on factors not within their control, such as incomplete or inaccurate data provided by me or distortions of diagnostic images or specimens that may result from electronic transmissions. I understand that the practice of medicine is not an exact science and that Practitioner makes no warranties or guarantees regarding treatment outcomes. I acknowledge that I will receive a copy of this consent concurrently upon execution via email to the email address I last provided but may also request a printed copy by calling the office of [facility].    I understand that my insurance will be billed for this visit.    I have read or had this consent read to me. . I understand the contents of this consent, which adequately explains the benefits and risks of the Services being provided via telemedicine.  . I have been provided ample opportunity to ask questions regarding this consent and the Services and have had my questions answered to my satisfaction. . I give my informed consent for the services to be provided through the use of telemedicine in my medical care  By participating in this telemedicine visit I agree to the above.

## 2019-02-02 NOTE — Progress Notes (Signed)
Virtual Visit via Video Note   This visit type was conducted due to national recommendations for restrictions regarding the COVID-19 Pandemic (e.g. social distancing) in an effort to limit this patient's exposure and mitigate transmission in our community.  Due to his co-morbid illnesses, this patient is at least at moderate risk for complications without adequate follow up.  This format is felt to be most appropriate for this patient at this time.  All issues noted in this document were discussed and addressed.  A limited physical exam was performed with this format.  Please refer to the patient's chart for his consent to telehealth for [facility]   Evaluation Performed:  Follow-up visit  Date:  02/03/2019   ID:  Colton Davidson, DOB Nov 21, 1955, MRN [id number]  Patient Location: Other:  his office Provider Location: Office  PCP:  Shirline Frees, MD  Cardiologist:  Candee Furbish, MD  Electrophysiologist:  None   Chief Complaint:  CAD with hx of stent.   History of Present Illness:    Colton Davidson is a 63 y.o. male with CAD from 02/23/17 with chest tightness and burning when heavily exerting himself with radiation to his neck and jaw that seem to be relieved when he decreased his effort. He was able to reduplicate the symptoms. His EKG on 02/23/17 showed new T-wave inversion in V3, biphasic as well as V4 through V6. They were concerning for ischemia. He also felt some associated shortness of breath. They began about 10 minutes and exercise when pushing himself on stairmaster or elliptical machine for instance.  His father had myocardial infarction at age 32s, CABG in his 21s.  Plays soccer.  Previously had an evaluation for palpitations with Holter monitor demonstrating minimum heart rate of 34 bpm 5 AM average of 55 maximum of 91 with 129 PVCs and 188 PACs. 20 beats of atrial tachycardia at 1 45 bpm noted. He was taking diltiazem for this but is no longer taking.  He was set up  for cardiac catheterization and have this performed on 03/04/17 by Dr. Peter Martinique. Proximal LAD disease was noted and successfully stented. Minimal LV dysfunction was noted with anterior and apical hypokinesis. Normal EDP. EF was 45-50%.  On last visit with Dr. Marlou Porch 01/17/18 pt was doing well.  No palpitations on ASA alone and plavix stopped.    Today he is doing well with no chest pain or SOB.  Since last visit he had lt knee surgery and recovered well.  He was playing soccer until the isolation.  He does travel with his work and despite this no symptoms of COVID 19. 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    He had colonoscopy in dec -has every 5 years because his father had colon cancer.  No issues.  His diet is healthy and he has decreased meat in his diet.   The patient does not have symptoms concerning for COVID-19 infection (fever, chills, cough, or new shortness of breath).    Past Medical History:  Diagnosis Date  . Osteoarthritis    back  . Other specified cardiac dysrhythmias(427.89)    bradycardia  . Palpitations   . Paroxysmal supraventricular tachycardia [facility])    Past Surgical History:  Procedure Laterality Date  . CORONARY STENT INTERVENTION N/A 03/04/2017   Procedure: Coronary Stent Intervention;  Surgeon: Martinique, Peter M, MD;  Location: [place] CV LAB;  Service: Cardiovascular;  Laterality: N/A;  prox lad  .  LEFT HEART CATH AND CORONARY ANGIOGRAPHY N/A 03/04/2017   Procedure: Left Heart Cath and Coronary Angiography;  Surgeon: Martinique, Peter M, MD;  Location: [place] CV LAB;  Service: Cardiovascular;  Laterality: N/A;  . NO PAST SURGERIES       Current Meds  Medication Sig  . aspirin EC 81 MG tablet Take 1 tablet (81 mg total) by mouth daily.  Marland Kitchen atorvastatin (LIPITOR) 80 MG tablet Take 0.5 tablets (40 mg total) by mouth daily at 6 PM.  . Clotrimazole (LOTRIMIN AF EX) Apply 1 application topically daily as needed  (athlete's foot).  . fluticasone (FLONASE) 50 MCG/ACT nasal spray Place 1 spray into both nostrils at bedtime as needed for allergies or rhinitis.  Marland Kitchen ibuprofen (ADVIL) 800 MG tablet Take 800 mg by mouth as needed.  . nitroGLYCERIN (NITROSTAT) 0.4 MG SL tablet Place 1 tablet (0.4 mg total) under the tongue every 5 (five) minutes as needed for chest pain.  . [DISCONTINUED] atorvastatin (LIPITOR) 80 MG tablet Take 0.5 tablets (40 mg total) by mouth daily at 6 PM.     Allergies:   Patient has no known allergies.   Social History   Tobacco Use  . Smoking status: Never Smoker  . Smokeless tobacco: Never Used  Substance Use Topics  . Alcohol use: Yes  . Drug use: No     Family Hx: The patient's family history includes Heart attack in his father; Heart disease in his father; Heart failure in his father; Hyperlipidemia in his mother.  ROS:   Please see the history of present illness.    General:no colds or fevers, no weight changes Skin:no rashes or ulcers HEENT:no blurred vision, no congestion CV:see HPI PUL:see HPI GI:no diarrhea constipation or melena, no indigestion GU:no hematuria, no dysuria MS:no joint pain surgery of Lt knee,  no claudication Neuro:no syncope, no lightheadedness Endo:no diabetes, no thyroid disease  All other systems reviewed and are negative.   Prior CV studies:   The following studies were reviewed today:  LHC: 03/04/17  Conclusion     Mid LAD to Dist LAD lesion, 20 %stenosed.  There is mild left ventricular systolic dysfunction.  LV end diastolic pressure is normal.  The left ventricular ejection fraction is 45-50% by visual estimate.  Prox LAD lesion, 95 %stenosed.  A STENT RESOLUTE ONYX 4.0X15 drug eluting stent was successfully placed.  Post intervention, there is a 0% residual stenosis.  1. Single vessel obstructive CAD. 95% proximal LAD 2. Mild LV dysfunction with anterior and apical HK 3. Normal LVEDP. 4. Successful stenting  of the proximal LAD with DES.   Plan: DAPT for one year with ASA/Plavix. High dose statin therapy. Anticipate same day DC      Labs/Other Tests and Data Reviewed:    EKG:  An ECG dated 03/05/17 was personally reviewed today and demonstrated:  SB at 54 and 1st degree AV block no acute changes.  Recent Labs: No results found for requested labs within last 8760 hours.   Recent Lipid Panel Lab Results  Component Value Date/Time   CHOL 127 07/20/2017 03:04 PM   TRIG 83 07/20/2017 03:04 PM   HDL 81 07/20/2017 03:04 PM   CHOLHDL 1.6 07/20/2017 03:04 PM   CHOLHDL 3.3 02/24/2016 07:47 AM   LDLCALC 29 07/20/2017 03:04 PM    Wt Readings from Last 3 Encounters:  02/03/19 215 lb (97.5 kg)  01/17/18 218 lb 12.8 oz (99.2 kg)  07/26/17 207 lb 3.7 oz (94 kg)  Objective:    Vital Signs:  BP (!) 142/86   Pulse (!) 58   Ht '5\' 11"'  (1.803 m)   Wt 215 lb (97.5 kg)   BMI 29.99 kg/m    Well nourished, well developed male in no acute distress. Skin pink Lungs, talks in complete sentences no SOB Neuro alert and oriented X 3  Psych pleasant affect   ASSESSMENT & PLAN:    1. CAD without angina prior stent to LAD, on ASA but no longer on plavix.  EF was 45-50% and no symptoms of HF. Follow up in 6 months with Dr. Marlou Porch and EKG at that time. 2. HLD on statin, will recheck in 3 months when less chance of virus. Continue 40 mg for now. 3.  SB stable and no lightheadedness  4. Hx of PAT no further episodes that pt is aware.   COVID-19 Education: The signs and symptoms of COVID-19 were discussed with the patient and how to seek care for testing (follow up with PCP or arrange E-visit).  The importance of social distancing was discussed today.  Time:   Today, I have spent  minutes with the patient with telehealth technology discussing the above problems.     Medication Adjustments/Labs and Tests Ordered: Current medicines are reviewed at length with the patient today.  Concerns  regarding medicines are outlined above.   Tests Ordered: Orders Placed This Encounter  Procedures  . Comp Met (CMET)  . Lipid panel    Medication Changes: Meds ordered this encounter  Medications  . atorvastatin (LIPITOR) 80 MG tablet    Sig: Take 0.5 tablets (40 mg total) by mouth daily at 6 PM.    Dispense:  90 tablet    Refill:  3    Medication was decreased to 71m daily!    Disposition:  Follow up in 6 month(s)  Signed, LCecilie Kicks NP  02/03/2019 8:54 AM    [place]

## 2019-02-03 ENCOUNTER — Encounter: Payer: Self-pay | Admitting: Cardiology

## 2019-02-03 ENCOUNTER — Telehealth (INDEPENDENT_AMBULATORY_CARE_PROVIDER_SITE_OTHER): Payer: 59 | Admitting: Cardiology

## 2019-02-03 ENCOUNTER — Other Ambulatory Visit: Payer: Self-pay

## 2019-02-03 VITALS — BP 142/86 | HR 58 | Ht 71.0 in | Wt 215.0 lb

## 2019-02-03 DIAGNOSIS — I471 Supraventricular tachycardia: Secondary | ICD-10-CM | POA: Diagnosis not present

## 2019-02-03 DIAGNOSIS — E7849 Other hyperlipidemia: Secondary | ICD-10-CM | POA: Diagnosis not present

## 2019-02-03 DIAGNOSIS — R001 Bradycardia, unspecified: Secondary | ICD-10-CM

## 2019-02-03 DIAGNOSIS — I2510 Atherosclerotic heart disease of native coronary artery without angina pectoris: Secondary | ICD-10-CM

## 2019-02-03 MED ORDER — ATORVASTATIN CALCIUM 80 MG PO TABS: 40.0000 mg | ORAL_TABLET | Freq: Every day | ORAL | 3 refills | Status: DC

## 2019-02-03 NOTE — Patient Instructions (Addendum)
Medication Instructions:  Your physician recommends that you continue on your current medications as directed. Please refer to the Current Medication list given to you today.  We have sent in a refill for the Atorvastatin to [facility], [employer] [place].  If you need a refill on your cardiac medications before your next appointment, please call your pharmacy.   Lab work: 3 MONTHS:  FASTING LIPIDS & CMET  You can call and schedule an appt for your lab work or just show up to the office after 7:30 a.m. for your lab work. MAKE SURE NOTHING TO EAT OR DRINK AFTER MIDNIGHT THE NIGHT BEFORE YOU PLAN TO GET YOUR LAB WORK DONE.  If you have labs (blood work) drawn today and your tests are completely normal, you will receive your results only by: Marland Kitchen MyChart Message (if you have MyChart) OR . A paper copy in the mail If you have any lab test that is abnormal or we need to change your treatment, we will call you to review the results.  Testing/Procedures: NONE ORDERED  Follow-Up: At [facility], you and your health needs are our priority.  As part of our continuing mission to provide you with exceptional heart care, we have created designated Provider Care Teams.  These Care Teams include your primary Cardiologist (physician) and Advanced Practice Providers (APPs -  Physician Assistants and Nurse Practitioners) who all work together to provide you with the care you need, when you need it. You will need a follow up appointment in 6 months.  Please call our office 2 months in advance to schedule this appointment.  You may see Donato Schultz, MD or one of the following Advanced Practice Providers on your designated Care Team:   Norma Fredrickson, NP Nada Boozer, NP . Georgie Chard, NP  Any Other Special Instructions Will Be Listed Below (If Applicable). - Monitor your blood pressure for the next week.  Call us if it is still staying elevated - Continue to exercise and eat healthy.

## 2019-09-12 ENCOUNTER — Ambulatory Visit: Payer: 59 | Admitting: Cardiology

## 2019-09-25 ENCOUNTER — Ambulatory Visit: Payer: 59 | Admitting: Cardiology

## 2019-09-25 ENCOUNTER — Other Ambulatory Visit: Payer: Self-pay

## 2019-09-25 ENCOUNTER — Encounter: Payer: Self-pay | Admitting: Cardiology

## 2019-09-25 VITALS — BP 146/80 | HR 49 | Ht 71.0 in | Wt 225.0 lb

## 2019-09-25 DIAGNOSIS — I4719 Other supraventricular tachycardia: Secondary | ICD-10-CM

## 2019-09-25 DIAGNOSIS — Z79899 Other long term (current) drug therapy: Secondary | ICD-10-CM

## 2019-09-25 DIAGNOSIS — I2510 Atherosclerotic heart disease of native coronary artery without angina pectoris: Secondary | ICD-10-CM

## 2019-09-25 DIAGNOSIS — I471 Supraventricular tachycardia: Secondary | ICD-10-CM

## 2019-09-25 DIAGNOSIS — E7849 Other hyperlipidemia: Secondary | ICD-10-CM | POA: Diagnosis not present

## 2019-09-25 LAB — CBC
Hematocrit: 37.7 % (ref 37.5–51.0)
Hemoglobin: 13.5 g/dL (ref 13.0–17.7)
MCH: 33.4 pg — ABNORMAL HIGH (ref 26.6–33.0)
MCHC: 35.8 g/dL — ABNORMAL HIGH (ref 31.5–35.7)
MCV: 93 fL (ref 79–97)
Platelets: 189 10*3/uL (ref 150–450)
RBC: 4.04 x10E6/uL — ABNORMAL LOW (ref 4.14–5.80)
RDW: 12 % (ref 11.6–15.4)
WBC: 4.9 10*3/uL (ref 3.4–10.8)

## 2019-09-25 LAB — COMPREHENSIVE METABOLIC PANEL
ALT: 23 IU/L (ref 0–44)
AST: 29 IU/L (ref 0–40)
Albumin/Globulin Ratio: 1.9 (ref 1.2–2.2)
Albumin: 4.2 g/dL (ref 3.8–4.8)
Alkaline Phosphatase: 60 IU/L (ref 39–117)
BUN/Creatinine Ratio: 17 (ref 10–24)
BUN: 17 mg/dL (ref 8–27)
Bilirubin Total: 0.5 mg/dL (ref 0.0–1.2)
CO2: 22 mmol/L (ref 20–29)
Calcium: 8.9 mg/dL (ref 8.6–10.2)
Chloride: 105 mmol/L (ref 96–106)
Creatinine, Ser: 0.98 mg/dL (ref 0.76–1.27)
GFR calc Af Amer: 96 mL/min/{1.73_m2} (ref >59)
GFR calc non Af Amer: 83 mL/min/{1.73_m2} (ref >59)
Globulin, Total: 2.2 g/dL (ref 1.5–4.5)
Glucose: 96 mg/dL (ref 65–99)
Potassium: 4.6 mmol/L (ref 3.5–5.2)
Sodium: 139 mmol/L (ref 134–144)
Total Protein: 6.4 g/dL (ref 6.0–8.5)

## 2019-09-25 LAB — LIPID PANEL
Chol/HDL Ratio: 1.9 ratio (ref 0.0–5.0)
Cholesterol, Total: 126 mg/dL (ref 100–199)
HDL: 68 mg/dL (ref >39)
LDL Chol Calc (NIH): 44 mg/dL (ref 0–99)
Triglycerides: 69 mg/dL (ref 0–149)
VLDL Cholesterol Cal: 14 mg/dL (ref 5–40)

## 2019-09-25 NOTE — Patient Instructions (Signed)
Medication Instructions:  The current medical regimen is effective;  continue present plan and medications.  *If you need a refill on your cardiac medications before your next appointment, please call your pharmacy*  Lab Work: Please have blood work today (CMP, CBC, Lipid) If you have labs (blood work) drawn today and your tests are completely normal, you will receive your results only by: Marland Kitchen MyChart Message (if you have MyChart) OR . A paper copy in the mail If you have any lab test that is abnormal or we need to change your treatment, we will call you to review the results.  Follow-Up: At [facility], you and your health needs are our priority.  As part of our continuing mission to provide you with exceptional heart care, we have created designated Provider Care Teams.  These Care Teams include your primary Cardiologist (physician) and Advanced Practice Providers (APPs -  Physician Assistants and Nurse Practitioners) who all work together to provide you with the care you need, when you need it.  Your next appointment:   12 month(s)  The format for your next appointment:   In Person  Provider:   Candee Furbish, MD  Thank you for choosing [facility]!!

## 2019-09-25 NOTE — Progress Notes (Signed)
Cardiology Office Note:    Date:  09/25/2019   ID:  Colton Davidson, DOB 03-Oct-1956, MRN [id number]  PCP:  Johny Blamer, MD  Cardiologist:  Donato Schultz, MD    Referring MD: Johny Blamer, MD     History of Present Illness:    Colton Davidson is a 63 y.o. male here for follow up of CAD post prox LAD stent.  He came to see me on 02/23/17 and was stating that he was having chest tightness and burning when heavily exerting himself with radiation to his neck and jaw that seem to be relieved when he decreased his effort. He was able to reduplicate the symptoms. His EKG on 02/23/17 showed new T-wave inversion in V3, biphasic as well as V4 through V6. They were concerning for ischemia. He also felt some associated shortness of breath. They began about 10 minutes and exercise when pushing himself on stairmaster or elliptical machine for instance.  His father had myocardial infarction at age 60s, CABG in his 20s, mother is currently alive in her 46s.  Plays soccer.  Previously had an evaluation for palpitations with Holter monitor demonstrating minimum heart rate of 34 bpm 5 AM average of 55 maximum of 91 with 129 PVCs and 188 PACs. 20 beats of atrial tachycardia at 145 bpm noted. He was taking diltiazem for this but is no longer taking.  He was set up for cardiac catheterization and have this performed on 03/04/17 by Dr. Peter Swaziland. Proximal LAD disease was noted and successfully stented. Minimal LV dysfunction was noted with anterior and apical hypokinesis. Normal EDP. EF was 45-50%.  His parents lived into their 79s. 3 children are going to schools, [employer], private college in [place], [employer] now [employer].  01/17/18 -overall doing well without any complaints, no syncope bleeding orthopnea PND. No palpitations.  Previously had some paroxysmal atrial tachycardia.  His LDL cholesterol is 29.  Lipitor is currently at 80, we will bring down to 40.  Since it is been  approximately 1 year, we will transition him over to aspirin only, stop the Plavix.  He is still playing quite a bit of soccer.  Active, more active than he has been.  Doing very well.  No anginal symptoms.  His children are doing well as well.  Increased stress at home, mother-in-law is now living with them on hospice.  09/25/2019 -Prior visit no palpitations Plavix was stopped aspirin alone.  Had knee surgery recovered well.  Still playing some soccer.  Father had colon cancer.  Sears meals at [employer].  Past Medical History:  Diagnosis Date  . Osteoarthritis    back  . Other specified cardiac dysrhythmias(427.89)    bradycardia  . Palpitations   . Paroxysmal supraventricular tachycardia [facility])     Past Surgical History:  Procedure Laterality Date  . CORONARY STENT INTERVENTION N/A 03/04/2017   Procedure: Coronary Stent Intervention;  Surgeon: Swaziland, Peter M, MD;  Location: [facility] INVASIVE CV LAB;  Service: Cardiovascular;  Laterality: N/A;  prox lad  . LEFT HEART CATH AND CORONARY ANGIOGRAPHY N/A 03/04/2017   Procedure: Left Heart Cath and Coronary Angiography;  Surgeon: Swaziland, Peter M, MD;  Location: [facility] INVASIVE CV LAB;  Service: Cardiovascular;  Laterality: N/A;  . NO PAST SURGERIES      Current Medications: Current Meds  Medication Sig  . aspirin EC 81 MG tablet Take 1 tablet (81 mg total) by mouth daily.  Marland Kitchen atorvastatin (LIPITOR) 80 MG tablet Take  0.5 tablets (40 mg total) by mouth daily at 6 PM.  . Clotrimazole (LOTRIMIN AF EX) Apply 1 application topically daily as needed (athlete's foot).  . fluticasone (FLONASE) 50 MCG/ACT nasal spray Place 1 spray into both nostrils at bedtime as needed for allergies or rhinitis.  Marland Kitchen ibuprofen (ADVIL) 800 MG tablet Take 800 mg by mouth as needed.  . nitroGLYCERIN (NITROSTAT) 0.4 MG SL tablet Place 1 tablet (0.4 mg total) under the tongue every 5 (five) minutes as needed for chest pain.     Allergies:   Patient has no known allergies.    Social History   Socioeconomic History  . Marital status: Married    Spouse name: Not on file  . Number of children: Not on file  . Years of education: Not on file  . Highest education level: Not on file  Occupational History  . Not on file  Social Needs  . Financial resource strain: Not on file  . Food insecurity    Worry: Not on file    Inability: Not on file  . Transportation needs    Medical: Not on file    Non-medical: Not on file  Tobacco Use  . Smoking status: Never Smoker  . Smokeless tobacco: Never Used  Substance and Sexual Activity  . Alcohol use: Yes  . Drug use: No  . Sexual activity: Not on file  Lifestyle  . Physical activity    Days per week: Not on file    Minutes per session: Not on file  . Stress: Not on file  Relationships  . Social Herbalist on phone: Not on file    Gets together: Not on file    Attends religious service: Not on file    Active member of club or organization: Not on file    Attends meetings of clubs or organizations: Not on file    Relationship status: Not on file  Other Topics Concern  . Not on file  Social History Narrative  . Not on file     Family History: The patient's family history includes Heart attack in his father; Heart disease in his father; Heart failure in his father; Hyperlipidemia in his mother. ROS:   Please see the history of present illness.   All other review of systems negative.  EKGs/Labs/Other Studies Reviewed:    The following studies were reviewed today:  LHC: 03/04/17  Conclusion     Mid LAD to Dist LAD lesion, 20 %stenosed.  There is mild left ventricular systolic dysfunction.  LV end diastolic pressure is normal.  The left ventricular ejection fraction is 45-50% by visual estimate.  Prox LAD lesion, 95 %stenosed.  A STENT RESOLUTE ONYX 4.0X15 drug eluting stent was successfully placed.  Post intervention, there is a 0% residual stenosis.  1. Single vessel  obstructive CAD. 95% proximal LAD 2. Mild LV dysfunction with anterior and apical HK 3. Normal LVEDP. 4. Successful stenting of the proximal LAD with DES.   Plan: DAPT for one year with ASA/Plavix. High dose statin therapy. Anticipate same day DC     EKG: 09/25/2019 sinus bradycardia 49 no other abnormalities PR interval 244 first-degree AV block EKG 03/05/2017 showed sinus bradycardia 54 with first-degree AV block no acute changes.  Recent Labs: No results found for requested labs within last 8760 hours.   Recent Lipid Panel    Component Value Date/Time   CHOL 127 07/20/2017 1504   TRIG 83 07/20/2017 1504   HDL 81  07/20/2017 1504   CHOLHDL 1.6 07/20/2017 1504   CHOLHDL 3.3 02/24/2016 0747   VLDL 18 02/24/2016 0747   LDLCALC 29 07/20/2017 1504    Physical Exam:    VS:  BP (!) 146/80   Pulse (!) 49   Ht  (1.803 m)   Wt 225 lb (102.1 kg)   SpO2 97%   BMI 31.38 kg/m     Wt Readings from Last 3 Encounters:  09/25/19 225 lb (102.1 kg)  02/03/19 215 lb (97.5 kg)  01/17/18 218 lb 12.8 oz (99.2 kg)     GEN: Well nourished, well developed, in no acute distress  HEENT: normal  Neck: no JVD, carotid bruits, or masses Cardiac: brady reg; no murmurs, rubs, or gallops,no edema  Respiratory:  clear to auscultation bilaterally, normal work of breathing GI: soft, nontender, nondistended, + BS MS: no deformity or atrophy  Skin: warm and dry, no rash Neuro:  Alert and Oriented x 3, Strength and sensation are intact Psych: euthymic mood, full affect    ASSESSMENT:    1. Coronary artery disease involving native coronary artery of native heart without angina pectoris   2. Other hyperlipidemia   3. Paroxysmal atrial tachycardia (HCC)   4. Long-term use of high-risk medication    PLAN:    In order of problems listed above:  Coronary artery disease/angina  - Successful stent of proximal LAD disease relieving anginal symptoms.  -Was on dual antiplatelet therapy for at  least one year, stent placed on 03/04/17.  Now aspirin monotherapy.  - He had same day discharge.  - EF minimally reduced at 45-50%.  NYHA class I.  -Enjoyed cardiac rehab.  LDL 29.  Changed to 40 mg from 80 mg of atorvastatin.  This is still a high intensity statin dose.  Unfortunately experienced a rib fracture from playing goalie stretched out and got kicked.  I offered him x-rays, declined.  Tylenol, NSAIDs.   Sinus bradycardia  - Normal resting heart rate ranges between 40 and 50. He is asymptomatic. No pacemaker.  Doing very well.  Athletic.  Avoiding beta-blockers.  Paroxysmal atrial tachycardia  - Previously seen on Holter monitor. Stable.  No symptoms.  At one time tried diltiazem but no longer taking.  Checking lab work today metabolic profile, lipids, CBC given statin and aspirin therapy coronary disease   Medication Adjustments/Labs and Tests Ordered: Current medicines are reviewed at length with the patient today.  Concerns regarding medicines are outlined above. Labs and tests ordered and medication changes are outlined in the patient instructions below:  Patient Instructions  Medication Instructions:  The current medical regimen is effective;  continue present plan and medications.  *If you need a refill on your cardiac medications before your next appointment, please call your pharmacy*  Lab Work: Please have blood work today (CMP, CBC, Lipid) If you have labs (blood work) drawn today and your tests are completely normal, you will receive your results only by: Marland Kitchen MyChart Message (if you have MyChart) OR . A paper copy in the mail If you have any lab test that is abnormal or we need to change your treatment, we will call you to review the results.  Follow-Up: At [facility], you and your health needs are our priority.  As part of our continuing mission to provide you with exceptional heart care, we have created designated Provider Care Teams.  These Care Teams include  your primary Cardiologist (physician) and Advanced Practice Providers (APPs -  Physician  Assistants and Nurse Practitioners) who all work together to provide you with the care you need, when you need it.  Your next appointment:   12 month(s)  The format for your next appointment:   In Person  Provider:   Donato SchultzMark Shiasia Porro, MD  Thank you for choosing [facility]!!         Signed, Donato SchultzMark Yina Riviere, MD  09/25/2019 8:41 AM    [facility]

## 2020-03-15 ENCOUNTER — Other Ambulatory Visit: Payer: Self-pay | Admitting: Cardiology

## 2020-04-24 ENCOUNTER — Other Ambulatory Visit: Payer: Self-pay

## 2020-04-24 MED ORDER — ATORVASTATIN CALCIUM 80 MG PO TABS: 40.0000 mg | ORAL_TABLET | Freq: Every day | ORAL | 1 refills | Status: DC

## 2020-04-24 NOTE — Telephone Encounter (Signed)
Pt's medication was sent to pt's pharmacy as requested. Confirmation received.  °

## 2020-07-14 ENCOUNTER — Other Ambulatory Visit: Payer: Self-pay

## 2020-07-14 ENCOUNTER — Encounter (HOSPITAL_COMMUNITY): Payer: Self-pay

## 2020-07-14 DIAGNOSIS — Y9366 Activity, soccer: Secondary | ICD-10-CM | POA: Insufficient documentation

## 2020-07-14 DIAGNOSIS — I2510 Atherosclerotic heart disease of native coronary artery without angina pectoris: Secondary | ICD-10-CM | POA: Insufficient documentation

## 2020-07-14 DIAGNOSIS — S0240EA Zygomatic fracture, right side, initial encounter for closed fracture: Secondary | ICD-10-CM | POA: Insufficient documentation

## 2020-07-14 DIAGNOSIS — Z7982 Long term (current) use of aspirin: Secondary | ICD-10-CM | POA: Insufficient documentation

## 2020-07-14 DIAGNOSIS — W500XXA Accidental hit or strike by another person, initial encounter: Secondary | ICD-10-CM | POA: Diagnosis not present

## 2020-07-14 DIAGNOSIS — S0993XA Unspecified injury of face, initial encounter: Secondary | ICD-10-CM | POA: Diagnosis present

## 2020-07-14 NOTE — ED Triage Notes (Signed)
Pt coming in c/o right facial pain after a head to head collision playing soccer today. No blurry vision. Endorses headache. No N/V

## 2020-07-15 ENCOUNTER — Emergency Department (HOSPITAL_COMMUNITY): Payer: Managed Care, Other (non HMO)

## 2020-07-15 ENCOUNTER — Emergency Department (HOSPITAL_COMMUNITY)
Admission: EM | Admit: 2020-07-15 | Discharge: 2020-07-15 | Disposition: A | Discharge status: Home / Self Care | Payer: Managed Care, Other (non HMO) | Attending: Emergency Medicine | Admitting: Emergency Medicine

## 2020-07-15 DIAGNOSIS — S0240EA Zygomatic fracture, right side, initial encounter for closed fracture: Secondary | ICD-10-CM

## 2020-07-15 MED ORDER — HYDROCODONE-ACETAMINOPHEN 5-325 MG PO TABS
1.0000 | ORAL_TABLET | ORAL | Status: AC | PRN
  Administered 2020-07-15: 1 via ORAL
  Filled 2020-07-15: qty 1

## 2020-07-15 MED ORDER — NAPROXEN 375 MG PO TABS: 375.0000 mg | ORAL_TABLET | Freq: Two times a day (BID) | ORAL | 0 refills | Status: AC

## 2020-07-15 NOTE — ED Notes (Signed)
Discharge paperwork reviewed with pt, including prescription.  Pt with no questions or concerns at time of discharge.

## 2020-07-15 NOTE — ED Notes (Signed)
Pt ambulatory to CT and back to room.

## 2020-07-15 NOTE — Discharge Instructions (Signed)
Avoid any trauma to that area.  Take medications as needed for pain.  Follow-up with Dr. Annalee Genta next week to discuss further treatment

## 2020-07-15 NOTE — ED Provider Notes (Signed)
[facility]-EMERGENCY DEPT Provider Note   CSN: [id number] Arrival date & time: 07/14/20  1738     History Chief Complaint  Patient presents with  . Facial Pain    Colton Davidson is a 64 y.o. male.  HPI   Pt was playing soccer when he was involved in a head on collision.  Pt was struck in the right temple area with another players head when they were going up for the ball.  Pt denied loc but was dazed.  Since that time he has had persistent severe pain in the right temple.  It hurts for him to open his jaw.  He denies nausea or vomiting.  No neck pain.  No numbness or weakness.  Past Medical History:  Diagnosis Date  . Osteoarthritis    back  . Other specified cardiac dysrhythmias(427.89)    bradycardia  . Palpitations   . Paroxysmal supraventricular tachycardia [facility])     Patient Active Problem List   Diagnosis Date Noted  . CAD in native artery with stent to LAD 02/03/2019  . Chest pain on exertion 03/04/2017  . Abnormal ECG 03/04/2017  . Palpitations 02/18/2015  . Sinus bradycardia 02/18/2015  . Paroxysmal atrial tachycardia (HCC) 02/18/2015    Past Surgical History:  Procedure Laterality Date  . CORONARY STENT INTERVENTION N/A 03/04/2017   Procedure: Coronary Stent Intervention;  Surgeon: Swaziland, Peter M, MD;  Location: [facility] INVASIVE CV LAB;  Service: Cardiovascular;  Laterality: N/A;  prox lad  . LEFT HEART CATH AND CORONARY ANGIOGRAPHY N/A 03/04/2017   Procedure: Left Heart Cath and Coronary Angiography;  Surgeon: Swaziland, Peter M, MD;  Location: [facility] INVASIVE CV LAB;  Service: Cardiovascular;  Laterality: N/A;  . NO PAST SURGERIES         Family History  Problem Relation Age of Onset  . Hyperlipidemia Mother   . Heart attack Father   . Heart disease Father   . Heart failure Father     Social History   Tobacco Use  . Smoking status: Never Smoker  . Smokeless tobacco: Never Used  Substance Use Topics  . Alcohol use: Yes  . Drug use:  No    Home Medications Prior to Admission medications   Medication Sig Start Date End Date Taking? Authorizing Provider  aspirin EC 81 MG tablet Take 1 tablet (81 mg total) by mouth daily. 04/08/17  Yes Jake Bathe, MD  atorvastatin (LIPITOR) 80 MG tablet Take 0.5 tablets (40 mg total) by mouth daily at 6 PM. 04/24/20  Yes Skains, Veverly Fells, MD  fluticasone (FLONASE) 50 MCG/ACT nasal spray Place 1 spray into both nostrils at bedtime as needed for allergies or rhinitis.   Yes [provider]  naproxen sodium (ALEVE) 220 MG tablet Take 220 mg by mouth 2 (two) times daily as needed (pain).   Yes [provider]  nitroGLYCERIN (NITROSTAT) 0.4 MG SL tablet Place 1 tablet (0.4 mg total) under the tongue every 5 (five) minutes as needed for chest pain. 02/23/17  Yes Jake Bathe, MD  naproxen (NAPROSYN) 375 MG tablet Take 1 tablet (375 mg total) by mouth 2 (two) times daily. 07/15/20   Linwood Dibbles, MD    Allergies    Patient has no known allergies.  Review of Systems   Review of Systems  All other systems reviewed and are negative.   Physical Exam Updated Vital Signs BP (!) 144/79   Pulse (!) 49   Temp 99.9 F (37.7 C) (  Oral)   Resp 20   Ht 1.791 m (5' 10.5")   Wt 83.9 kg   SpO2 99%   BMI 26.17 kg/m   Physical Exam Vitals and nursing note reviewed.  Constitutional:      General: He is not in acute distress.    Appearance: He is well-developed.  HENT:     Head: Normocephalic.     Comments: ttp right temple, no malocclusion but pain with jaw movement.    Right Ear: External ear normal.     Left Ear: External ear normal.  Eyes:     General: No scleral icterus.       Right eye: No discharge.        Left eye: No discharge.     Conjunctiva/sclera: Conjunctivae normal.  Neck:     Trachea: No tracheal deviation.  Cardiovascular:     Rate and Rhythm: Normal rate.  Pulmonary:     Effort: Pulmonary effort is normal. No respiratory distress.     Breath sounds: No  stridor.  Abdominal:     General: There is no distension.  Musculoskeletal:        General: No swelling or deformity.     Cervical back: Normal and neck supple. No tenderness.     Thoracic back: Normal. No tenderness.     Lumbar back: Normal. No tenderness.  Skin:    General: Skin is warm and dry.     Findings: No rash.  Neurological:     Mental Status: He is alert.     Cranial Nerves: Cranial nerve deficit: no gross deficits.     ED Results / Procedures / Treatments   Labs (all labs ordered are listed, but only abnormal results are displayed) Labs Reviewed - No data to display  EKG None  Radiology CT HEAD WO CONTRAST  Result Date: 07/15/2020 CLINICAL DATA:  Head at contact while playing soccer with headaches and facial pain, initial encounter EXAM: CT HEAD WITHOUT CONTRAST CT MAXILLOFACIAL WITHOUT CONTRAST TECHNIQUE: Multidetector CT imaging of the head and maxillofacial structures were performed using the standard protocol without intravenous contrast. Multiplanar CT image reconstructions of the maxillofacial structures were also generated. COMPARISON:  None. FINDINGS: CT HEAD FINDINGS Brain: No evidence of acute infarction, hemorrhage, hydrocephalus, extra-axial collection or mass lesion/mass effect. Vascular: No hyperdense vessel or unexpected calcification. Skull: Normal. Negative for fracture or focal lesion. Other: Right zygomatic arch fracture is noted. CT MAXILLOFACIAL FINDINGS Osseous: Comminuted right zygomatic arch fracture is noted. No other fractures are seen. Orbits: Orbits and their contents are within normal limits. Sinuses: Paranasal sinuses are unremarkable. Ostiomeatal complexes are patent. Soft tissues: Mild soft tissue swelling is noted over the right cheek in the region of the zygomatic arch fracture. No sizable hematoma is seen. IMPRESSION: CT of the head: No acute intracranial abnormality noted. CT of the maxillofacial bones: Right zygomatic arch fracture with  overlying soft tissue swelling Electronically Signed   By: Alcide Clever M.D.   On: 07/15/2020 09:51   CT MAXILLOFACIAL WO CONTRAST  Result Date: 07/15/2020 CLINICAL DATA:  Head at contact while playing soccer with headaches and facial pain, initial encounter EXAM: CT HEAD WITHOUT CONTRAST CT MAXILLOFACIAL WITHOUT CONTRAST TECHNIQUE: Multidetector CT imaging of the head and maxillofacial structures were performed using the standard protocol without intravenous contrast. Multiplanar CT image reconstructions of the maxillofacial structures were also generated. COMPARISON:  None. FINDINGS: CT HEAD FINDINGS Brain: No evidence of acute infarction, hemorrhage, hydrocephalus, extra-axial collection or mass lesion/mass effect.  Vascular: No hyperdense vessel or unexpected calcification. Skull: Normal. Negative for fracture or focal lesion. Other: Right zygomatic arch fracture is noted. CT MAXILLOFACIAL FINDINGS Osseous: Comminuted right zygomatic arch fracture is noted. No other fractures are seen. Orbits: Orbits and their contents are within normal limits. Sinuses: Paranasal sinuses are unremarkable. Ostiomeatal complexes are patent. Soft tissues: Mild soft tissue swelling is noted over the right cheek in the region of the zygomatic arch fracture. No sizable hematoma is seen. IMPRESSION: CT of the head: No acute intracranial abnormality noted. CT of the maxillofacial bones: Right zygomatic arch fracture with overlying soft tissue swelling Electronically Signed   By: Alcide Clever M.D.   On: 07/15/2020 09:51    Procedures Procedures (including critical care time)  Medications Ordered in ED Medications  HYDROcodone-acetaminophen (NORCO/VICODIN) 5-325 MG per tablet 1 tablet (1 tablet Oral Given 07/15/20 1014)    ED Course  I have reviewed the triage vital signs and the nursing notes.  Pertinent labs & imaging results that were available during my care of the patient were reviewed by me and considered in my  medical decision making (see chart for details).  Clinical Course as of Jul 15 1046  Mon Jul 15, 2020  1033 D/w Dr Annalee Genta.  Call the office to make an appointment next week   [JK]    Clinical Course User Index [JK] Linwood Dibbles, MD   MDM Rules/Calculators/A&P                          Discussed case with Dr. Annalee Genta.  No need for any emergent treatment.  Patient will need to follow-up with ENT within the week.  No signs of serious head injury but CT scan of the face does show zygomatic arch fracture. Final Clinical Impression(s) / ED Diagnoses Final diagnoses:  Closed fracture of right zygomatic arch, initial encounter (HCC)    Rx / DC Orders ED Discharge Orders         Ordered    naproxen (NAPROSYN) 375 MG tablet  2 times daily        07/15/20 1039           Linwood Dibbles, MD 07/15/20 1047

## 2020-09-09 ENCOUNTER — Other Ambulatory Visit: Payer: Self-pay | Admitting: Cardiology

## 2020-09-24 ENCOUNTER — Ambulatory Visit (INDEPENDENT_AMBULATORY_CARE_PROVIDER_SITE_OTHER): Payer: Managed Care, Other (non HMO) | Admitting: Cardiology

## 2020-09-24 ENCOUNTER — Other Ambulatory Visit: Payer: Self-pay

## 2020-09-24 ENCOUNTER — Encounter: Payer: Self-pay | Admitting: Cardiology

## 2020-09-24 VITALS — BP 140/80 | HR 45 | Ht 70.5 in | Wt 221.0 lb

## 2020-09-24 DIAGNOSIS — I471 Supraventricular tachycardia: Secondary | ICD-10-CM

## 2020-09-24 DIAGNOSIS — E7849 Other hyperlipidemia: Secondary | ICD-10-CM | POA: Diagnosis not present

## 2020-09-24 DIAGNOSIS — R001 Bradycardia, unspecified: Secondary | ICD-10-CM

## 2020-09-24 DIAGNOSIS — I2510 Atherosclerotic heart disease of native coronary artery without angina pectoris: Secondary | ICD-10-CM | POA: Diagnosis not present

## 2020-09-24 NOTE — Patient Instructions (Signed)
Medication Instructions:  The current medical regimen is effective;  continue present plan and medications.  *If you need a refill on your cardiac medications before your next appointment, please call your pharmacy*  Follow-Up: At [facility], you and your health needs are our priority.  As part of our continuing mission to provide you with exceptional heart care, we have created designated Provider Care Teams.  These Care Teams include your primary Cardiologist (physician) and Advanced Practice Providers (APPs -  Physician Assistants and Nurse Practitioners) who all work together to provide you with the care you need, when you need it.  We recommend signing up for the patient portal called "MyChart".  Sign up information is provided on this After Visit Summary.  MyChart is used to connect with patients for Virtual Visits (Telemedicine).  Patients are able to view lab/test results, encounter notes, upcoming appointments, etc.  Non-urgent messages can be sent to your provider as well.   To learn more about what you can do with MyChart, go to [email or web address].    Your next appointment:   12 month(s)  The format for your next appointment:   In Person  Provider:   Mark Skains, MD   Thank you for choosing [facility]!!      

## 2020-09-24 NOTE — Progress Notes (Signed)
Cardiology Office Note:    Date:  09/24/2020   ID:  Colton Davidson, DOB 1955-11-19, MRN [id number]  PCP:  Johny Blamer, MD  [facility] Cardiologist:  Donato Schultz, MD  [facility] Electrophysiologist:  None   Referring MD: Johny Blamer, MD     History of Present Illness:    Colton Davidson is a 64 y.o. male here for the follow-up of coronary artery disease.  Cardiac catheterization 03/04/2017: Proximal LAD disease severe successfully stented, Dr. Swaziland. EF 45 to 50% with apical hypokinesis.  He had typical anginal symptoms with tightness burning radiation to jaw and neck with T wave inversions V3 V4 and V6.  Father had MI in his 43s, CABG in his 74s.  Mother 77 dieds.  Enjoys soccer.  He had a head-on collision with another soccer player on 07/15/20-no loss of consciousness but had a zygomatic fracture.  Was minimally displaced comminuted fracture.  This was treated nonsurgically.  Having some trouble with crossbite.   Past Medical History:  Diagnosis Date  . Osteoarthritis    back  . Other specified cardiac dysrhythmias(427.89)    bradycardia  . Palpitations   . Paroxysmal supraventricular tachycardia [facility])     Past Surgical History:  Procedure Laterality Date  . CORONARY STENT INTERVENTION N/A 03/04/2017   Procedure: Coronary Stent Intervention;  Surgeon: Swaziland, Peter M, MD;  Location: [facility] INVASIVE CV LAB;  Service: Cardiovascular;  Laterality: N/A;  prox lad  . LEFT HEART CATH AND CORONARY ANGIOGRAPHY N/A 03/04/2017   Procedure: Left Heart Cath and Coronary Angiography;  Surgeon: Swaziland, Peter M, MD;  Location: [facility] INVASIVE CV LAB;  Service: Cardiovascular;  Laterality: N/A;  . NO PAST SURGERIES      Current Medications: Current Meds  Medication Sig  . aspirin EC 81 MG tablet Take 1 tablet (81 mg total) by mouth daily.  Marland Kitchen atorvastatin (LIPITOR) 80 MG tablet TAKE 1/2 OF A TABLET(40 MG TOTAL) BY MOUTH DAILY AT 6 PM  . fluticasone (FLONASE) 50 MCG/ACT nasal  spray Place 1 spray into both nostrils at bedtime as needed for allergies or rhinitis.  . naproxen (NAPROSYN) 375 MG tablet Take 1 tablet (375 mg total) by mouth 2 (two) times daily.  . naproxen sodium (ALEVE) 220 MG tablet Take 220 mg by mouth 2 (two) times daily as needed (pain).  . nitroGLYCERIN (NITROSTAT) 0.4 MG SL tablet Place 1 tablet (0.4 mg total) under the tongue every 5 (five) minutes as needed for chest pain.     Allergies:   Patient has no known allergies.   Social History   Socioeconomic History  . Marital status: Married    Spouse name: Not on file  . Number of children: Not on file  . Years of education: Not on file  . Highest education level: Not on file  Occupational History  . Not on file  Tobacco Use  . Smoking status: Never Smoker  . Smokeless tobacco: Never Used  Substance and Sexual Activity  . Alcohol use: Yes  . Drug use: No  . Sexual activity: Not on file  Other Topics Concern  . Not on file  Social History Narrative  . Not on file   Social Determinants of Health   Financial Resource Strain:   . Difficulty of Paying Living Expenses: Not on file  Food Insecurity:   . Worried About Programme researcher, broadcasting/film/video in the Last Year: Not on file  . Ran Out of Food in the Last Year: Not  on file  Transportation Needs:   . Lack of Transportation (Medical): Not on file  . Lack of Transportation (Non-Medical): Not on file  Physical Activity:   . Days of Exercise per Week: Not on file  . Minutes of Exercise per Session: Not on file  Stress:   . Feeling of Stress : Not on file  Social Connections:   . Frequency of Communication with Friends and Family: Not on file  . Frequency of Social Gatherings with Friends and Family: Not on file  . Attends Religious Services: Not on file  . Active Member of Clubs or Organizations: Not on file  . Attends Banker Meetings: Not on file  . Marital Status: Not on file     Family History: The patient's family  history includes Heart attack in his father; Heart disease in his father; Heart failure in his father; Hyperlipidemia in his mother.  ROS:   Please see the history of present illness.    Other than jaw pain, no fevers chills nausea vomiting syncope bleeding.  All other systems reviewed and are negative.  EKGs/Labs/Other Studies Reviewed:    The following studies were reviewed today: Prior catheterization reviewed as above.  Lab work reviewed.  EKG:  EKG is  ordered today.  The ekg ordered today demonstrates sinus bradycardia 45 no changes from before.  Stable.  Recent Labs: No results found for requested labs within last 8760 hours.  Recent Lipid Panel    Component Value Date/Time   CHOL 126 09/25/2019 0844   TRIG 69 09/25/2019 0844   HDL 68 09/25/2019 0844   CHOLHDL 1.9 09/25/2019 0844   CHOLHDL 3.3 02/24/2016 0747   VLDL 18 02/24/2016 0747   LDLCALC 44 09/25/2019 0844     Risk Assessment/Calculations:       Physical Exam:    VS:  BP 140/80 (BP Location: Right Arm, Patient Position: Sitting, Cuff Size: Normal)   Pulse (!) 45   Ht 5' 10.5" (1.791 m)   Wt 221 lb (100.2 kg)   SpO2 99%   BMI 31.26 kg/m     Wt Readings from Last 3 Encounters:  09/24/20 221 lb (100.2 kg)  07/14/20 185 lb (83.9 kg)  09/25/19 225 lb (102.1 kg)     GEN:  Well nourished, well developed in no acute distress HEENT: Normal NECK: No JVD; No carotid bruits LYMPHATICS: No lymphadenopathy CARDIAC: Bradycardic regular, no murmurs, rubs, gallops RESPIRATORY:  Clear to auscultation without rales, wheezing or rhonchi  ABDOMEN: Soft, non-tender, non-distended MUSCULOSKELETAL:  No edema; No deformity  SKIN: Warm and dry NEUROLOGIC:  Alert and oriented x 3 PSYCHIATRIC:  Normal affect   ASSESSMENT:    1. Coronary artery disease involving native coronary artery of native heart without angina pectoris   2. Paroxysmal atrial tachycardia (HCC)   3. Other hyperlipidemia   4. Sinus bradycardia     PLAN:    In order of problems listed above:  Coronary artery disease -LAD stent 2018.  Doing well.  Continue with aspirin.  No anginal symptoms.  Hemoglobin 14.2 creatinine 0.9 TSH 3.1 ALT 29  Hyperlipidemia -Continue with high intensity statin.  LDL has ranged from 44-61 at last check from outside labs.  On 40 mg of atorvastatin.  No side effects.  Zygomatic fracture -Seeing dentist today.  Injury while playing soccer.  Elevated blood pressure -140/80-continue to monitor.  May need antihypertensive in the future.      Shared Decision Making/Informed Consent  Medication Adjustments/Labs and Tests Ordered: Current medicines are reviewed at length with the patient today.  Concerns regarding medicines are outlined above.  Orders Placed This Encounter  Procedures  . EKG 12-Lead   No orders of the defined types were placed in this encounter.   Patient Instructions  Medication Instructions:  The current medical regimen is effective;  continue present plan and medications.  *If you need a refill on your cardiac medications before your next appointment, please call your pharmacy*  Follow-Up: At [facility], you and your health needs are our priority.  As part of our continuing mission to provide you with exceptional heart care, we have created designated Provider Care Teams.  These Care Teams include your primary Cardiologist (physician) and Advanced Practice Providers (APPs -  Physician Assistants and Nurse Practitioners) who all work together to provide you with the care you need, when you need it.  We recommend signing up for the patient portal called "MyChart".  Sign up information is provided on this After Visit Summary.  MyChart is used to connect with patients for Virtual Visits (Telemedicine).  Patients are able to view lab/test results, encounter notes, upcoming appointments, etc.  Non-urgent messages can be sent to your provider as well.   To learn more about  what you can do with MyChart, go to [email or web address].    Your next appointment:   12 month(s)  The format for your next appointment:   In Person  Provider:   Donato Schultz, MD   Thank you for choosing [facility]!!         Signed, Donato Schultz, MD  09/24/2020 10:45 AM    [facility]

## 2021-05-12 ENCOUNTER — Ambulatory Visit
Admission: RE | Admit: 2021-05-12 | Discharge: 2021-05-12 | Disposition: A | Discharge status: Home / Self Care | Payer: Managed Care, Other (non HMO) | Source: Ambulatory Visit | Attending: Emergency Medicine | Admitting: Emergency Medicine

## 2021-05-12 ENCOUNTER — Other Ambulatory Visit: Payer: Self-pay

## 2021-05-12 VITALS — BP 154/87 | HR 54 | Temp 98.8°F | Resp 18

## 2021-05-12 DIAGNOSIS — H6012 Cellulitis of left external ear: Secondary | ICD-10-CM

## 2021-05-12 MED ORDER — PREDNISONE 20 MG PO TABS: 40.0000 mg | ORAL_TABLET | Freq: Every day | ORAL | 0 refills | Status: AC

## 2021-05-12 MED ORDER — CLINDAMYCIN HCL 300 MG PO CAPS: 300.0000 mg | ORAL_CAPSULE | Freq: Three times a day (TID) | ORAL | 0 refills | Status: AC

## 2021-05-12 NOTE — ED Provider Notes (Signed)
[facility]    CSN: [id number] Arrival date & time: 05/12/21  1200      History   Chief Complaint Chief Complaint  Patient presents with   Allergic Reaction    HPI Colton Davidson is a 65 y.o. male history of paroxysmal SVT, CAD, presenting today for evaluation of possible allergic reaction.  Reports over the past 2 days has developed increased swelling pain discomfort and itching to his left ear.  Denies any known bite or sting.  Denies other rashes or areas of swelling to body.  Denies difficulty breathing or any shortness of breath.  Has had subjective fevers.  Does report history of cellulitis.  HPI  Past Medical History:  Diagnosis Date   Osteoarthritis    back   Other specified cardiac dysrhythmias(427.89)    bradycardia   Palpitations    Paroxysmal supraventricular tachycardia [facility])     Patient Active Problem List   Diagnosis Date Noted   CAD in native artery with stent to LAD 02/03/2019   Chest pain on exertion 03/04/2017   Abnormal ECG 03/04/2017   Palpitations 02/18/2015   Sinus bradycardia 02/18/2015   Paroxysmal atrial tachycardia (HCC) 02/18/2015    Past Surgical History:  Procedure Laterality Date   CORONARY STENT INTERVENTION N/A 03/04/2017   Procedure: Coronary Stent Intervention;  Surgeon: Swaziland, Peter M, MD;  Location: [facility] INVASIVE CV LAB;  Service: Cardiovascular;  Laterality: N/A;  prox lad   LEFT HEART CATH AND CORONARY ANGIOGRAPHY N/A 03/04/2017   Procedure: Left Heart Cath and Coronary Angiography;  Surgeon: Swaziland, Peter M, MD;  Location: [facility] INVASIVE CV LAB;  Service: Cardiovascular;  Laterality: N/A;   NO PAST SURGERIES         Home Medications    Prior to Admission medications   Medication Sig Start Date End Date Taking? Authorizing Provider  clindamycin (CLEOCIN) 300 MG capsule Take 1 capsule (300 mg total) by mouth 3 (three) times daily for 7 days. 05/12/21 05/19/21 Yes Cleopha Indelicato C, PA-C  predniSONE (DELTASONE) 20 MG  tablet Take 2 tablets (40 mg total) by mouth daily with breakfast for 5 days. 05/12/21 05/17/21 Yes Linell Shawn C, PA-C  aspirin EC 81 MG tablet Take 1 tablet (81 mg total) by mouth daily. 04/08/17   Jake Bathe, MD  atorvastatin (LIPITOR) 80 MG tablet TAKE 1/2 OF A TABLET(40 MG TOTAL) BY MOUTH DAILY AT 6 PM 09/09/20   Jake Bathe, MD  fluticasone (FLONASE) 50 MCG/ACT nasal spray Place 1 spray into both nostrils at bedtime as needed for allergies or rhinitis.    [provider]  naproxen (NAPROSYN) 375 MG tablet Take 1 tablet (375 mg total) by mouth 2 (two) times daily. 07/15/20   Linwood Dibbles, MD  naproxen sodium (ALEVE) 220 MG tablet Take 220 mg by mouth 2 (two) times daily as needed (pain).    [provider]  nitroGLYCERIN (NITROSTAT) 0.4 MG SL tablet Place 1 tablet (0.4 mg total) under the tongue every 5 (five) minutes as needed for chest pain. 02/23/17   Jake Bathe, MD    Family History Family History  Problem Relation Age of Onset   Hyperlipidemia Mother    Heart attack Father    Heart disease Father    Heart failure Father     Social History Social History   Tobacco Use   Smoking status: Never   Smokeless tobacco: Never  Substance Use Topics   Alcohol use: Yes   Drug use:  No     Allergies   Patient has no known allergies.   Review of Systems Review of Systems  Constitutional:  Negative for fatigue and fever.  HENT:  Positive for ear pain and facial swelling.   Eyes:  Negative for redness, itching and visual disturbance.  Respiratory:  Negative for shortness of breath.   Cardiovascular:  Negative for chest pain and leg swelling.  Gastrointestinal:  Negative for nausea and vomiting.  Musculoskeletal:  Negative for arthralgias and myalgias.  Skin:  Positive for color change and rash. Negative for wound.  Neurological:  Negative for dizziness, syncope, weakness, light-headedness and headaches.    Physical Exam Triage Vital Signs ED Triage  Vitals  Enc Vitals Group     BP      Pulse      Resp      Temp      Temp src      SpO2      Weight      Height      Head Circumference      Peak Flow      Pain Score      Pain Loc      Pain Edu?      Excl. in GC?    No data found.  Updated Vital Signs BP (!) 154/87   Pulse (!) 54   Temp 98.8 F (37.1 C) (Oral)   Resp 18   SpO2 97%   Visual Acuity Right Eye Distance:   Left Eye Distance:   Bilateral Distance:    Right Eye Near:   Left Eye Near:    Bilateral Near:     Physical Exam Vitals and nursing note reviewed.  Constitutional:      Appearance: He is well-developed.     Comments: No acute distress  HENT:     Head: Normocephalic and atraumatic.     Ears:     Comments: Left external ear erythematous and edematous, does not extend into canal, TM intact with good bony landmarks, no erythema    Nose: Nose normal.  Eyes:     Conjunctiva/sclera: Conjunctivae normal.  Neck:     Comments: No lymphadenopathy Cardiovascular:     Rate and Rhythm: Normal rate.  Pulmonary:     Effort: Pulmonary effort is normal. No respiratory distress.  Abdominal:     General: There is no distension.  Musculoskeletal:        General: Normal range of motion.     Cervical back: Neck supple.  Skin:    General: Skin is warm and dry.  Neurological:     Mental Status: He is alert and oriented to person, place, and time.     UC Treatments / Results  Labs (all labs ordered are listed, but only abnormal results are displayed) Labs Reviewed - No data to display  EKG   Radiology No results found.  Procedures Procedures (including critical care time)  Medications Ordered in UC Medications - No data to display  Initial Impression / Assessment and Plan / UC Course  I have reviewed the triage vital signs and the nursing notes.  Pertinent labs & imaging results that were available during my care of the patient were reviewed by me and considered in my medical decision making  (see chart for details).     Left ear concerning for cellulitis versus allergic reaction-given warmth and history of cellulitis opting to proceed with clindamycin as well as course of prednisone to help the swelling and  antihistamines, continue to monitor,Discussed strict return precautions. Patient verbalized understanding and is agreeable with plan.  Final Clinical Impressions(s) / UC Diagnoses   Final diagnoses:  Cellulitis of left ear     Discharge Instructions      Clindamycin every 8 hours x1 week to cover cellulitis-please take with food and use with probiotic to help limit diarrhea associated Prednisone 40 mg daily x5 days to help with swelling May continue antihistamines-cetirizine/Zyrtec regarding/Claritin in the morning, Benadryl in the evening or at bedtime Please continue to monitor swelling and discomfort on ear, follow-up in 48 to 72 hours if not seeing any improvement with above     ED Prescriptions     Medication Sig Dispense Auth. Provider   clindamycin (CLEOCIN) 300 MG capsule Take 1 capsule (300 mg total) by mouth 3 (three) times daily for 7 days. 21 capsule Tully Burgo C, PA-C   predniSONE (DELTASONE) 20 MG tablet Take 2 tablets (40 mg total) by mouth daily with breakfast for 5 days. 10 tablet Kohle Winner, [place] C, PA-C      PDMP not reviewed this encounter.   Lew Dawes, [place] 05/12/21 1313

## 2021-05-12 NOTE — ED Triage Notes (Signed)
Pt is present today with a possible allergic reaction. Pt has visible swelling and irritation to his left ear. Pt states that he noticed the pain yesterday

## 2021-05-12 NOTE — Discharge Instructions (Addendum)
Clindamycin every 8 hours x1 week to cover cellulitis-please take with food and use with probiotic to help limit diarrhea associated Prednisone 40 mg daily x5 days to help with swelling May continue antihistamines-cetirizine/Zyrtec regarding/Claritin in the morning, Benadryl in the evening or at bedtime Please continue to monitor swelling and discomfort on ear, follow-up in 48 to 72 hours if not seeing any improvement with above

## 2021-08-11 ENCOUNTER — Other Ambulatory Visit: Payer: Self-pay | Admitting: *Deleted

## 2021-08-11 MED ORDER — ATORVASTATIN CALCIUM 80 MG PO TABS
ORAL_TABLET | ORAL | 1 refills | Status: DC
Start: 2021-08-11 — End: 2022-04-08

## 2021-09-11 IMAGING — CT CT MAXILLOFACIAL W/O CM
3 series · 14 of 47 positions shown, 16 images · non-contrast
Comparison: None.

CLINICAL DATA: Head at contact while playing soccer with headaches
and facial pain, initial encounter

EXAM:
CT HEAD WITHOUT CONTRAST
CT MAXILLOFACIAL WITHOUT CONTRAST
TECHNIQUE: Multidetector CT imaging of the head and maxillofacial structures
were performed using the standard protocol without intravenous
contrast. Multiplanar CT image reconstructions of the maxillofacial
structures were also generated.

[Series 3: max soft · axial · 0.40mm/px · z∈[+1394,+1560]mm · 8 of 97 slices shown, 10 images]
[im 7/97  brain]
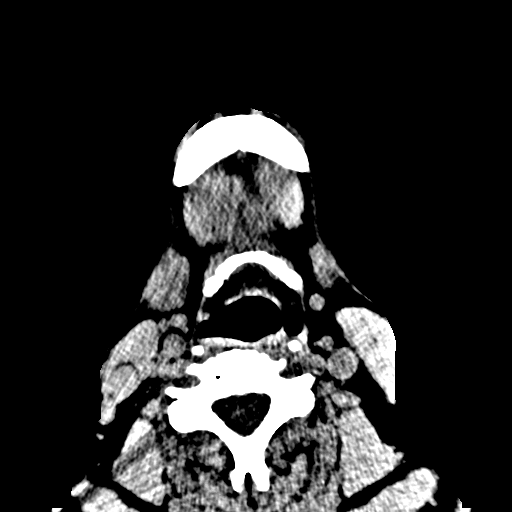
[im 7/97  bone]
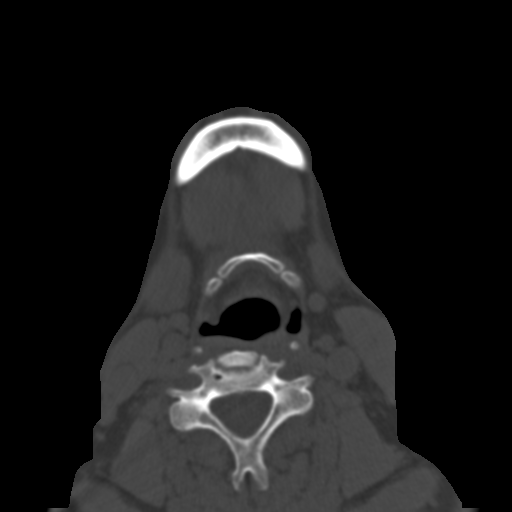
[im 20/97  bone]
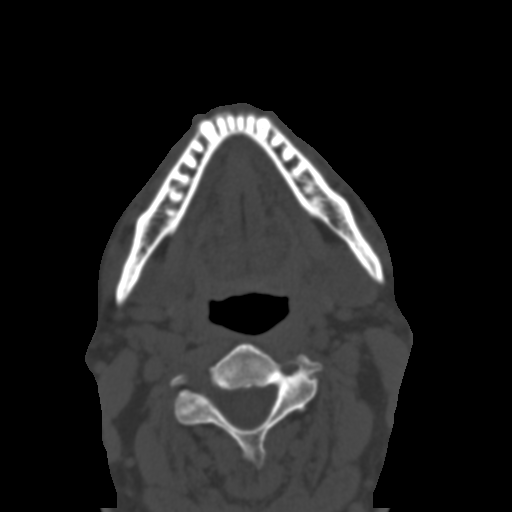
[im 30/97  bone]
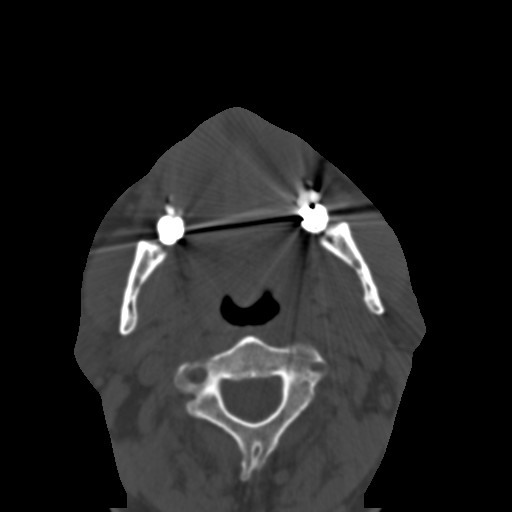
[im 44/97  bone]
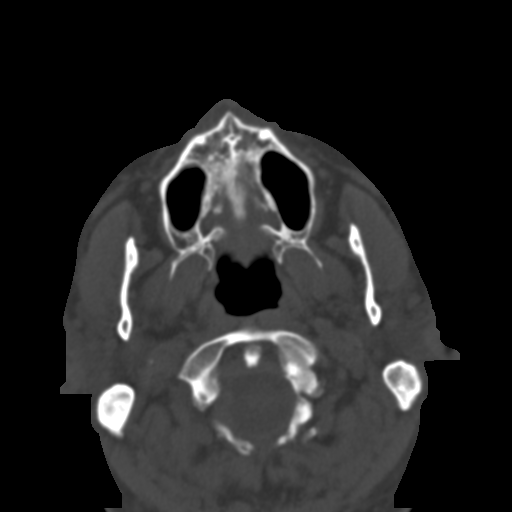
[im 53/97  brain]
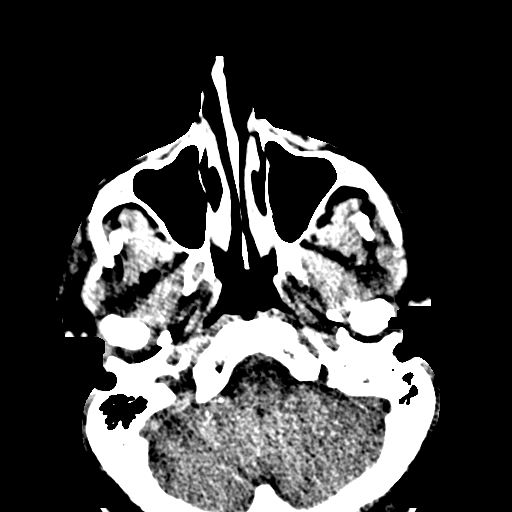
[im 53/97  bone]
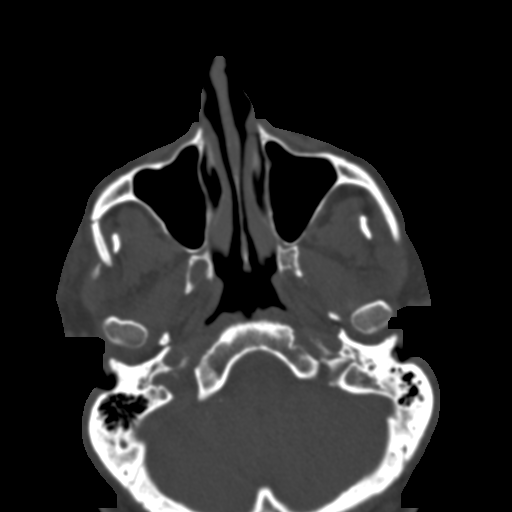
[im 67/97  bone]
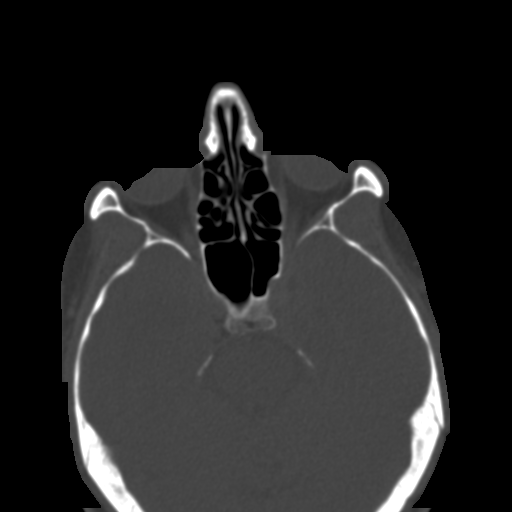
[im 77/97  bone]
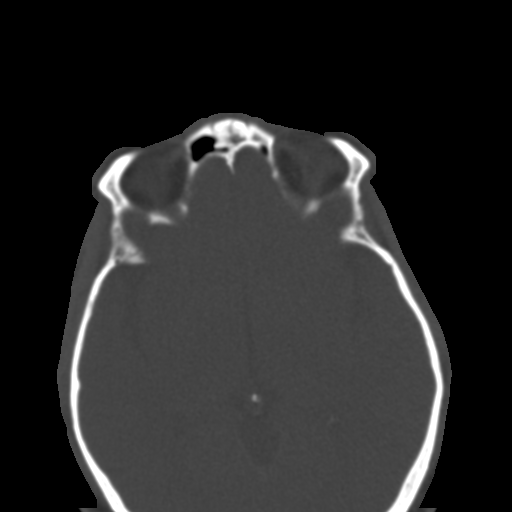
[im 90/97  bone]
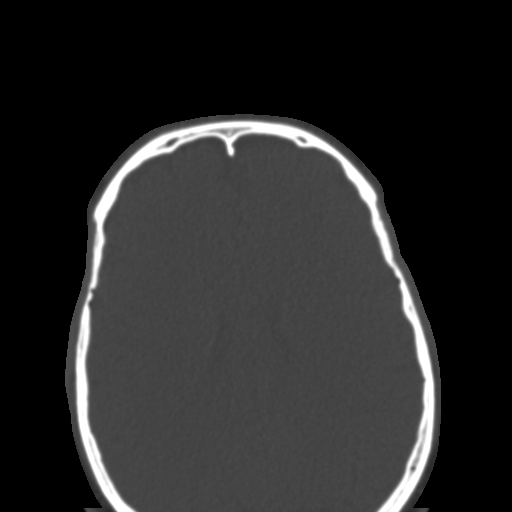

[Series 7: coronal soft · coronal · 0.44mm/px · 3 of 118 slices shown]
[im 40/118  bone]
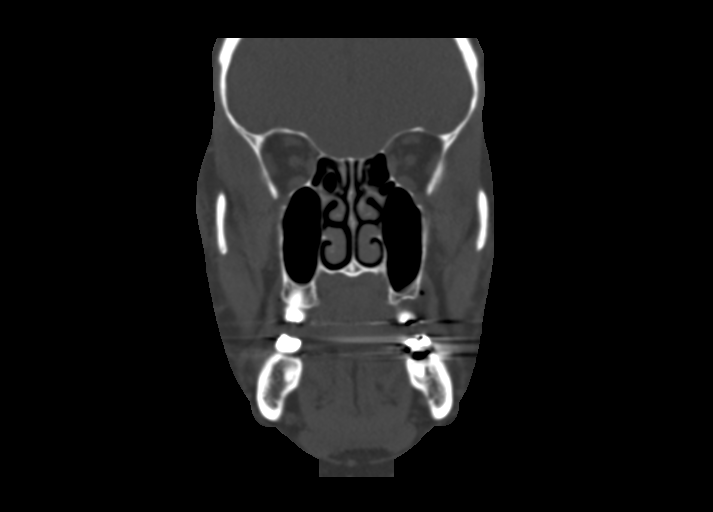
[im 53/118  bone]
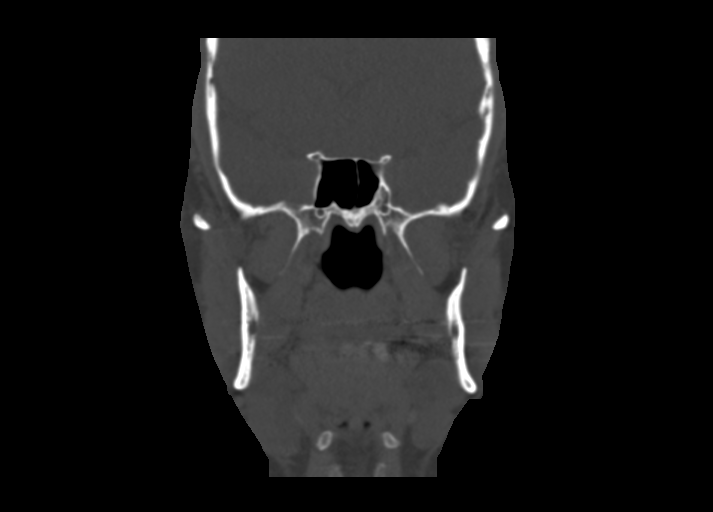
[im 66/118  bone]
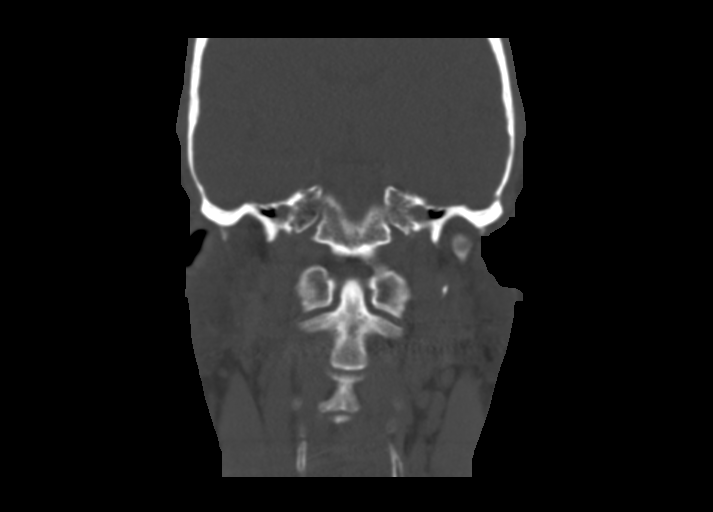

[Series 8: sagittal soft · sagittal · 0.45mm/px · 3 of 90 slices shown]
[im 30/90  bone]
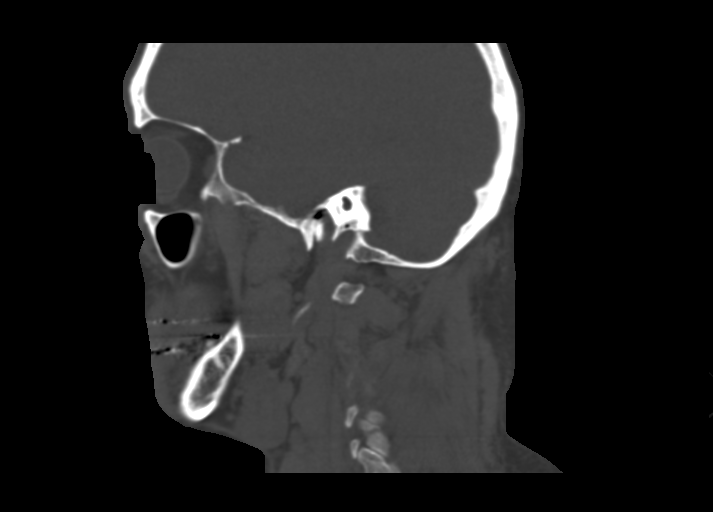
[im 45/90  bone]
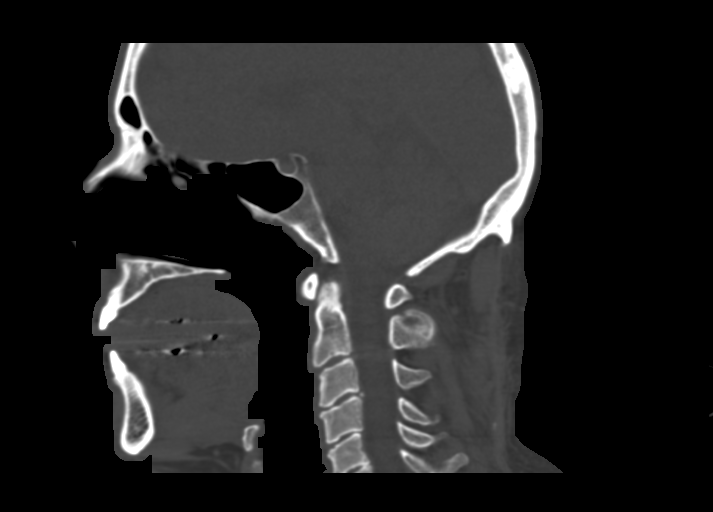
[im 60/90  bone]
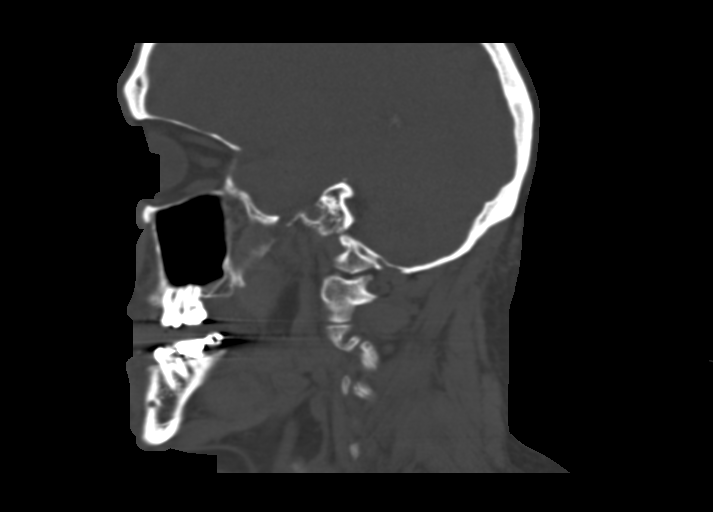

[14 of 47 positions shown; findings below may reference images not displayed]

FINDINGS: CT HEAD FINDINGS

Brain: No evidence of acute infarction, hemorrhage, hydrocephalus,
extra-axial collection or mass lesion/mass effect.

Vascular: No hyperdense vessel or unexpected calcification.

Skull: Normal. Negative for fracture or focal lesion.

Other: Right zygomatic arch fracture is noted.

CT MAXILLOFACIAL FINDINGS

Osseous: Comminuted right zygomatic arch fracture is noted. No other
fractures are seen.

Orbits: Orbits and their contents are within normal limits.

Sinuses: Paranasal sinuses are unremarkable. Ostiomeatal complexes
are patent.

Soft tissues: Mild soft tissue swelling is noted over the right
cheek in the region of the zygomatic arch fracture. No sizable
hematoma is seen.
IMPRESSION: CT of the head: No acute intracranial abnormality noted.

CT of the maxillofacial bones: Right zygomatic arch fracture with
overlying soft tissue swelling

## 2021-12-02 ENCOUNTER — Ambulatory Visit (INDEPENDENT_AMBULATORY_CARE_PROVIDER_SITE_OTHER): Payer: 59 | Admitting: Cardiology

## 2021-12-02 ENCOUNTER — Other Ambulatory Visit: Payer: Self-pay

## 2021-12-02 ENCOUNTER — Encounter: Payer: Self-pay | Admitting: Cardiology

## 2021-12-02 DIAGNOSIS — E7800 Pure hypercholesterolemia, unspecified: Secondary | ICD-10-CM

## 2021-12-02 DIAGNOSIS — R001 Bradycardia, unspecified: Secondary | ICD-10-CM | POA: Diagnosis not present

## 2021-12-02 DIAGNOSIS — I2510 Atherosclerotic heart disease of native coronary artery without angina pectoris: Secondary | ICD-10-CM

## 2021-12-02 DIAGNOSIS — R42 Dizziness and giddiness: Secondary | ICD-10-CM | POA: Diagnosis not present

## 2021-12-02 NOTE — Assessment & Plan Note (Signed)
When working with trainer, doing planks for instance when he stands up he may feel some dizziness for about 20 seconds.  Be careful.  Stay hydrated.  He will let us know how his blood pressures are at home.  He does believe that his wife has a cuff.

## 2021-12-02 NOTE — Assessment & Plan Note (Signed)
Doing well without any anginal symptoms.  Exercising with a trainer.  EKG unremarkable.  Continue with goal-directed medical therapy which includes aspirin 81 mg.  Continue with atorvastatin 40 mg a day.

## 2021-12-02 NOTE — Assessment & Plan Note (Signed)
LDL 69 in November 2022.  Excellent.  Normal liver function.  Creatinine 1.15 hemoglobin 14.5 continue with atorvastatin 40 mg a day.

## 2021-12-02 NOTE — Patient Instructions (Signed)
Medication Instructions:  The current medical regimen is effective;  continue present plan and medications.  *If you need a refill on your cardiac medications before your next appointment, please call your pharmacy*  Follow-Up: At [facility], you and your health needs are our priority.  As part of our continuing mission to provide you with exceptional heart care, we have created designated Provider Care Teams.  These Care Teams include your primary Cardiologist (physician) and Advanced Practice Providers (APPs -  Physician Assistants and Nurse Practitioners) who all work together to provide you with the care you need, when you need it.  We recommend signing up for the patient portal called "MyChart".  Sign up information is provided on this After Visit Summary.  MyChart is used to connect with patients for Virtual Visits (Telemedicine).  Patients are able to view lab/test results, encounter notes, upcoming appointments, etc.  Non-urgent messages can be sent to your provider as well.   To learn more about what you can do with MyChart, go to [email or web address].    Your next appointment:   1 year(s)  The format for your next appointment:   In Person  Provider:   Mark Skains, MD   Thank you for choosing [facility]!!    

## 2021-12-02 NOTE — Assessment & Plan Note (Signed)
No changes heart rate 57.
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
## 2022-04-08 ENCOUNTER — Other Ambulatory Visit: Payer: Self-pay

## 2022-04-08 MED ORDER — ATORVASTATIN CALCIUM 80 MG PO TABS: ORAL_TABLET | ORAL | 2 refills | Status: DC

## 2022-11-04 ENCOUNTER — Encounter: Payer: Self-pay | Admitting: Cardiology

## 2022-11-30 ENCOUNTER — Encounter: Payer: Self-pay | Admitting: Cardiology

## 2022-11-30 ENCOUNTER — Ambulatory Visit: Payer: 59 | Attending: Cardiology | Admitting: Cardiology

## 2022-11-30 VITALS — BP 153/84 | HR 55 | Ht 70.5 in | Wt 229.4 lb

## 2022-11-30 DIAGNOSIS — R42 Dizziness and giddiness: Secondary | ICD-10-CM | POA: Diagnosis not present

## 2022-11-30 DIAGNOSIS — R001 Bradycardia, unspecified: Secondary | ICD-10-CM | POA: Diagnosis not present

## 2022-11-30 DIAGNOSIS — E7800 Pure hypercholesterolemia, unspecified: Secondary | ICD-10-CM | POA: Diagnosis not present

## 2022-11-30 DIAGNOSIS — I2510 Atherosclerotic heart disease of native coronary artery without angina pectoris: Secondary | ICD-10-CM
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
## 2022-11-30 NOTE — Patient Instructions (Signed)
Medication Instructions:  The current medical regimen is effective;  continue present plan and medications.  *If you need a refill on your cardiac medications before your next appointment, please call your pharmacy*  Follow-Up: At [facility], you and your health needs are our priority.  As part of our continuing mission to provide you with exceptional heart care, we have created designated Provider Care Teams.  These Care Teams include your primary Cardiologist (physician) and Advanced Practice Providers (APPs -  Physician Assistants and Nurse Practitioners) who all work together to provide you with the care you need, when you need it.  We recommend signing up for the patient portal called "MyChart".  Sign up information is provided on this After Visit Summary.  MyChart is used to connect with patients for Virtual Visits (Telemedicine).  Patients are able to view lab/test results, encounter notes, upcoming appointments, etc.  Non-urgent messages can be sent to your provider as well.   To learn more about what you can do with MyChart, go to [email or web address].    Your next appointment:   1 year(s)  Provider:   Candee Furbish, MD

## 2023-03-01 ENCOUNTER — Other Ambulatory Visit: Payer: Self-pay | Admitting: *Deleted

## 2023-03-01 MED ORDER — ATORVASTATIN CALCIUM 80 MG PO TABS: ORAL_TABLET | ORAL | 3 refills | Status: DC

## 2024-04-04 ENCOUNTER — Other Ambulatory Visit: Payer: Self-pay

## 2024-04-04 MED ORDER — ATORVASTATIN CALCIUM 80 MG PO TABS: ORAL_TABLET | ORAL | 0 refills | Status: DC

## 2024-04-17 ENCOUNTER — Telehealth: Payer: Self-pay | Admitting: Cardiology

## 2024-04-17 MED ORDER — ATORVASTATIN CALCIUM 80 MG PO TABS: ORAL_TABLET | ORAL | 2 refills | Status: DC

## 2024-04-17 NOTE — Telephone Encounter (Signed)
 Pt's medication was sent to pt's pharmacy as requested. Confirmation received.

## 2024-04-17 NOTE — Telephone Encounter (Signed)
 *  STAT* If patient is at the pharmacy, call can be transferred to refill team.   1. Which medications need to be refilled? (please list name of each medication and dose if known)   atorvastatin  (LIPITOR ) 80 MG tablet   2. Which pharmacy/location (including street and city if local pharmacy) is medication to be sent to?  [facility] [id number] - [place], [place] - [address]    3. Do they need a 30 day or 90 day supply? 30  Patient is scheduled for follow up with Dr Jeffrie on 07/18/24 and will need enough medication to cover him until his appointment

## 2024-07-18 ENCOUNTER — Ambulatory Visit: Payer: Self-pay | Attending: Cardiology | Admitting: Cardiology

## 2024-07-18 ENCOUNTER — Encounter: Payer: Self-pay | Admitting: Cardiology

## 2024-07-18 VITALS — BP 151/78 | HR 57 | Ht 70.5 in | Wt 215.0 lb

## 2024-07-18 DIAGNOSIS — R42 Dizziness and giddiness: Secondary | ICD-10-CM | POA: Diagnosis not present

## 2024-07-18 DIAGNOSIS — R9431 Abnormal electrocardiogram [ECG] [EKG]: Secondary | ICD-10-CM | POA: Diagnosis not present

## 2024-07-18 DIAGNOSIS — I2510 Atherosclerotic heart disease of native coronary artery without angina pectoris: Secondary | ICD-10-CM
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
## 2024-07-18 NOTE — Patient Instructions (Signed)
 Medication Instructions:  The current medical regimen is effective;  continue present plan and medications.  *If you need a refill on your cardiac medications before your next appointment, please call your pharmacy*  Testing/Procedures:    Please report to Radiology at the [facility] Main Entrance 30 minutes early for your test.  [address] [place], [place] [address]  How to Prepare for Your Cardiac PET/CT Stress Test:  Nothing to eat or drink, except water, 3 hours prior to arrival time.  NO caffeine/decaffeinated products, or chocolate 12 hours prior to arrival. (Please note decaffeinated beverages (teas/coffees) still contain caffeine).  If you have caffeine within 12 hours prior, the test will need to be rescheduled.  Medication instructions: Do not take erectile dysfunction medications for 72 hours prior to test (sildenafil, tadalafil) Do not take nitrates (isosorbide mononitrate, Ranexa) the day before or day of test Do not take tamsulosin the day before or morning of test Hold theophylline containing medications for 12 hours. Hold Dipyridamole 48 hours prior to the test.  Diabetic Preparation: If able to eat breakfast prior to 3 hour fasting, you may take all medications, including your insulin. Do not worry if you miss your breakfast dose of insulin - start at your next meal. If you do not eat prior to 3 hour fast-Hold all diabetes (oral and insulin) medications. Patients who wear a continuous glucose monitor MUST remove the device prior to scanning.  You may take your remaining medications with water.  NO perfume, cologne or lotion on chest or abdomen area. FEMALES - Please avoid wearing dresses to this appointment.  Total time is 1 to 2 hours; you may want to bring reading material for the waiting time.  IF YOU THINK YOU MAY BE PREGNANT, OR ARE NURSING PLEASE INFORM THE TECHNOLOGIST.  In preparation for your appointment, medication and supplies will be  purchased.  Appointment availability is limited, so if you need to cancel or reschedule, please call the Radiology Department Scheduler at [phone] 24 hours in advance to avoid a cancellation fee of $100.00  What to Expect When you Arrive:  Once you arrive and check in for your appointment, you will be taken to a preparation room within the Radiology Department.  A technologist or Nurse will obtain your medical history, verify that you are correctly prepped for the exam, and explain the procedure.  Afterwards, an IV will be started in your arm and electrodes will be placed on your skin for EKG monitoring during the stress portion of the exam. Then you will be escorted to the PET/CT scanner.  There, staff will get you positioned on the scanner and obtain a blood pressure and EKG.  During the exam, you will continue to be connected to the EKG and blood pressure machines.  A small, safe amount of a radioactive tracer will be injected in your IV to obtain a series of pictures of your heart along with an injection of a stress agent.    After your Exam:  It is recommended that you eat a meal and drink a caffeinated beverage to counter act any effects of the stress agent.  Drink plenty of fluids for the remainder of the day and urinate frequently for the first couple of hours after the exam.  Your doctor will inform you of your test results within 7-10 business days.  For more information and frequently asked questions, please visit our website: [email or web address]  For questions about your test or how to prepare  for your test, please call: Cardiac Imaging Nurse Navigators Office: [phone]   Follow-Up: At [facility], you and your health needs are our priority.  As part of our continuing mission to provide you with exceptional heart care, our providers are all part of one team.  This team includes your primary Cardiologist (physician) and Advanced Practice Providers or  APPs (Physician Assistants and Nurse Practitioners) who all work together to provide you with the care you need, when you need it.  Your next appointment:   1 year(s)  Provider:   Oneil Parchment, MD    We recommend signing up for the patient portal called MyChart.  Sign up information is provided on this After Visit Summary.  MyChart is used to connect with patients for Virtual Visits (Telemedicine).  Patients are able to view lab/test results, encounter notes, upcoming appointments, etc.  Non-urgent messages can be sent to your provider as well.   To learn more about what you can do with MyChart, go to [email or web address].

## 2024-07-26 ENCOUNTER — Encounter (HOSPITAL_COMMUNITY): Payer: Self-pay

## 2024-07-27 ENCOUNTER — Ambulatory Visit
Admission: RE | Admit: 2024-07-27 | Discharge: 2024-07-27 | Disposition: A | Discharge status: Home / Self Care | Source: Ambulatory Visit | Attending: Cardiology | Admitting: Cardiology

## 2024-07-27 DIAGNOSIS — I700 Atherosclerosis of aorta: Secondary | ICD-10-CM | POA: Diagnosis not present

## 2024-07-27 DIAGNOSIS — I2510 Atherosclerotic heart disease of native coronary artery without angina pectoris: Secondary | ICD-10-CM | POA: Insufficient documentation

## 2024-07-27 DIAGNOSIS — R9431 Abnormal electrocardiogram [ECG] [EKG]: Secondary | ICD-10-CM | POA: Insufficient documentation

## 2024-07-27 MED ORDER — RUBIDIUM RB82 GENERATOR (RUBYFILL)
25.0000 | PACK | Freq: Once | INTRAVENOUS | Status: AC
Start: 2024-07-27 — End: 2024-07-27
  Administered 2024-07-27: 25.18 via INTRAVENOUS

## 2024-07-27 MED ORDER — REGADENOSON 0.4 MG/5ML IV SOLN
0.4000 mg | Freq: Once | INTRAVENOUS | Status: AC
  Administered 2024-07-27: 0.4 mg via INTRAVENOUS
  Filled 2024-07-27: qty 5

## 2024-07-27 MED ORDER — REGADENOSON 0.4 MG/5ML IV SOLN
INTRAVENOUS | Status: AC
  Filled 2024-07-27: qty 5

## 2024-07-27 MED ORDER — RUBIDIUM RB82 GENERATOR (RUBYFILL)
25.0000 | PACK | Freq: Once | INTRAVENOUS | Status: AC
Start: 2024-07-27 — End: 2024-07-27
  Administered 2024-07-27: 25.09 via INTRAVENOUS

## 2024-07-27 NOTE — Progress Notes (Signed)
 Pt tolerated lexiscan. Pt stated feeling his heart was racing, which subsided soon after medication admin. Minor HA at end of scan, no other symptoms. PO caffeine given and all questions answered.

## 2024-07-28 ENCOUNTER — Ambulatory Visit: Payer: Self-pay | Admitting: Cardiology

## 2024-07-28 DIAGNOSIS — I2510 Atherosclerotic heart disease of native coronary artery without angina pectoris: Secondary | ICD-10-CM

## 2024-07-28 DIAGNOSIS — R001 Bradycardia, unspecified: Secondary | ICD-10-CM

## 2024-07-28 LAB — NM PET CT CARDIAC PERFUSION MULTI W/ABSOLUTE BLOODFLOW
LV dias vol: 252 mL (ref 62–150)
LV sys vol: 136 mL (ref 4.2–5.8)
MBFR: 2.48
Nuc Rest EF: 40 %
Nuc Stress EF: 46 %
Peak HR: 69 {beats}/min
Rest HR: 53 {beats}/min
Rest MBF: 0.62 ml/g/min
Rest Nuclear Isotope Dose: 25.1 mCi
SRS: 4
SSS: 4
ST Depression (mm): 0 mm
Stress MBF: 1.54 ml/g/min
Stress Nuclear Isotope Dose: 25.2 mCi
TID: 1.04

## 2024-08-03 ENCOUNTER — Ambulatory Visit (HOSPITAL_COMMUNITY)
Admission: RE | Admit: 2024-08-03 | Discharge: 2024-08-03 | Disposition: A | Discharge status: Home / Self Care | Source: Ambulatory Visit | Attending: Cardiology | Admitting: Cardiology

## 2024-08-03 DIAGNOSIS — R001 Bradycardia, unspecified: Secondary | ICD-10-CM | POA: Insufficient documentation

## 2024-08-03 DIAGNOSIS — I2510 Atherosclerotic heart disease of native coronary artery without angina pectoris: Secondary | ICD-10-CM | POA: Insufficient documentation

## 2024-08-03 DIAGNOSIS — I351 Nonrheumatic aortic (valve) insufficiency: Secondary | ICD-10-CM | POA: Insufficient documentation

## 2024-08-03 LAB — ECHOCARDIOGRAM COMPLETE
Area-P 1/2: 4.33 cm2
Calc EF: 56.1 %
P 1/2 time: 738 ms
S' Lateral: 5.1 cm
Single Plane A2C EF: 50.6 %
Single Plane A4C EF: 62.8 %

## 2024-08-03 MED ORDER — PERFLUTREN LIPID MICROSPHERE
1.0000 mL | INTRAVENOUS | Status: AC | PRN
  Administered 2024-08-03: 2 mL via INTRAVENOUS

## 2024-08-05 ENCOUNTER — Ambulatory Visit: Payer: Self-pay | Admitting: Cardiology

## 2024-09-15 ENCOUNTER — Other Ambulatory Visit: Payer: Self-pay | Admitting: Cardiology
